# Patient Record
Sex: Female | Born: 1990 | Race: Black or African American | Hispanic: No | Marital: Married | Smoking: Current some day smoker
Health system: Southern US, Community
[De-identification: ages and names within clinical notes are randomized; demographics above are authoritative.]

## PROBLEM LIST (undated history)

## (undated) ENCOUNTER — Inpatient Hospital Stay (HOSPITAL_COMMUNITY): Payer: Self-pay

## (undated) DIAGNOSIS — E079 Disorder of thyroid, unspecified: Secondary | ICD-10-CM

## (undated) DIAGNOSIS — F32A Depression, unspecified: Secondary | ICD-10-CM

## (undated) DIAGNOSIS — F419 Anxiety disorder, unspecified: Secondary | ICD-10-CM

## (undated) DIAGNOSIS — F329 Major depressive disorder, single episode, unspecified: Secondary | ICD-10-CM

## (undated) HISTORY — PX: GANGLION CYST EXCISION: SHX1691

## (undated) HISTORY — PX: MOUTH SURGERY: SHX715

---

## 2015-06-20 ENCOUNTER — Emergency Department (HOSPITAL_BASED_OUTPATIENT_CLINIC_OR_DEPARTMENT_OTHER): Payer: Medicaid Other

## 2015-06-20 ENCOUNTER — Encounter (HOSPITAL_BASED_OUTPATIENT_CLINIC_OR_DEPARTMENT_OTHER): Payer: Self-pay | Admitting: Emergency Medicine

## 2015-06-20 ENCOUNTER — Emergency Department (HOSPITAL_BASED_OUTPATIENT_CLINIC_OR_DEPARTMENT_OTHER)
Admission: EM | Admit: 2015-06-20 | Discharge: 2015-06-21 | Disposition: A | Discharge status: Home / Self Care | Payer: Medicaid Other | Attending: Emergency Medicine | Admitting: Emergency Medicine

## 2015-06-20 DIAGNOSIS — Z3202 Encounter for pregnancy test, result negative: Secondary | ICD-10-CM | POA: Insufficient documentation

## 2015-06-20 DIAGNOSIS — S59911A Unspecified injury of right forearm, initial encounter: Secondary | ICD-10-CM | POA: Insufficient documentation

## 2015-06-20 DIAGNOSIS — S63501A Unspecified sprain of right wrist, initial encounter: Secondary | ICD-10-CM | POA: Diagnosis not present

## 2015-06-20 DIAGNOSIS — Y92410 Unspecified street and highway as the place of occurrence of the external cause: Secondary | ICD-10-CM | POA: Insufficient documentation

## 2015-06-20 DIAGNOSIS — S29012A Strain of muscle and tendon of back wall of thorax, initial encounter: Secondary | ICD-10-CM | POA: Diagnosis not present

## 2015-06-20 DIAGNOSIS — Y9389 Activity, other specified: Secondary | ICD-10-CM | POA: Insufficient documentation

## 2015-06-20 DIAGNOSIS — S29019A Strain of muscle and tendon of unspecified wall of thorax, initial encounter: Secondary | ICD-10-CM

## 2015-06-20 DIAGNOSIS — Y998 Other external cause status: Secondary | ICD-10-CM | POA: Insufficient documentation

## 2015-06-20 DIAGNOSIS — F17200 Nicotine dependence, unspecified, uncomplicated: Secondary | ICD-10-CM | POA: Diagnosis not present

## 2015-06-20 DIAGNOSIS — S6991XA Unspecified injury of right wrist, hand and finger(s), initial encounter: Secondary | ICD-10-CM | POA: Diagnosis present

## 2015-06-20 LAB — PREGNANCY, URINE: PREG TEST UR: NEGATIVE

## 2015-06-20 MED ORDER — HYDROCODONE-ACETAMINOPHEN 5-325 MG PO TABS
1.0000 | ORAL_TABLET | Freq: Once | ORAL | Status: AC
  Administered 2015-06-20: 1 via ORAL
  Filled 2015-06-20: qty 1

## 2015-06-20 NOTE — ED Notes (Signed)
MD at bedside. 
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
## 2015-06-21 MED ORDER — HYDROCODONE-ACETAMINOPHEN 5-325 MG PO TABS: 1.0000 | ORAL_TABLET | Freq: Four times a day (QID) | ORAL | Status: DC | PRN

## 2016-07-25 ENCOUNTER — Emergency Department (HOSPITAL_BASED_OUTPATIENT_CLINIC_OR_DEPARTMENT_OTHER)
Admission: EM | Admit: 2016-07-25 | Discharge: 2016-07-25 | Disposition: A | Discharge status: Home / Self Care | Payer: Medicaid Other | Attending: Emergency Medicine | Admitting: Emergency Medicine

## 2016-07-25 ENCOUNTER — Encounter (HOSPITAL_BASED_OUTPATIENT_CLINIC_OR_DEPARTMENT_OTHER): Payer: Self-pay | Admitting: Emergency Medicine

## 2016-07-25 DIAGNOSIS — F17200 Nicotine dependence, unspecified, uncomplicated: Secondary | ICD-10-CM | POA: Diagnosis not present

## 2016-07-25 DIAGNOSIS — J069 Acute upper respiratory infection, unspecified: Secondary | ICD-10-CM | POA: Diagnosis not present

## 2016-07-25 DIAGNOSIS — R51 Headache: Secondary | ICD-10-CM | POA: Diagnosis present

## 2016-07-25 MED ORDER — GUAIFENESIN ER 1200 MG PO TB12: 1.0000 | ORAL_TABLET | Freq: Two times a day (BID) | ORAL | 0 refills | Status: DC

## 2016-07-25 MED ORDER — PREDNISONE 50 MG PO TABS: 50.0000 mg | ORAL_TABLET | Freq: Every day | ORAL | 0 refills | Status: DC

## 2016-07-25 MED ORDER — BUDESONIDE 32 MCG/ACT NA SUSP: 2.0000 | Freq: Every day | NASAL | 0 refills | Status: DC

## 2016-07-25 MED ORDER — ACETAMINOPHEN-CODEINE 120-12 MG/5ML PO SOLN: 10.0000 mL | ORAL | 0 refills | Status: DC | PRN

## 2016-07-25 MED ORDER — HYDROCOD POLST-CPM POLST ER 10-8 MG/5ML PO SUER
5.0000 mL | Freq: Once | ORAL | Status: AC
  Administered 2016-07-25: 5 mL via ORAL
  Filled 2016-07-25: qty 5

## 2016-07-25 NOTE — ED Provider Notes (Signed)
[facility]   CSN: [id number] Arrival date & time: 07/25/16  2026 By signing my name below, I, Levon HedgerElizabeth Hall, attest that this documentation has been prepared under the direction and in the presence of non-physician practitioner, Ebbie Ridgehris Jennell Janosik, PA-C. Electronically Signed: Levon HedgerElizabeth Hall, Scribe. 07/25/2016. 10:48 PM.   History   Chief Complaint Chief Complaint  Patient presents with  . Facial Pain   HPI Katherine ConchStephanie Gregory is a 25 y.o. female who presents to the Emergency Department complaining of constant, moderate left sided sinus pain onset this morning. Pt states she has had URI symptoms for a few days prior to this; she notes associated photophobia, phonophobia, and left sided headache, sore throat, left sided dental pain, nasal drainage, nausea, and congestion. She has taken mucinex with no relief. No alleviating factors noted. Pt denies any vomiting or fever.   The history is provided by the patient. No language interpreter was used.   History reviewed. No pertinent past medical history.  There are no active problems to display for this patient.  History reviewed. No pertinent surgical history.  OB History    No data available      Home Medications    Prior to Admission medications   Medication Sig Start Date End Date Taking? Authorizing Provider  HYDROcodone-acetaminophen (NORCO) 5-325 MG tablet Take 1-2 tablets by mouth every 6 (six) hours as needed (for pain). 06/21/15   Paula LibraJohn Molpus, MD   Family History History reviewed. No pertinent family history.  Social History Social History  Substance Use Topics  . Smoking status: Current Every Day Smoker  . Smokeless tobacco: Never Used  . Alcohol use Yes    Allergies   Patient has no known allergies.  Review of Systems Review of Systems 10 systems reviewed and all are negative for acute change except as noted in the HPI.  Physical Exam Updated Vital Signs BP 102/62 (BP Location: Left  Arm)   Pulse 88   Temp 98 F (36.7 C) (Oral)   Resp 18   Ht 5\' 5"  (1.651 m)   Wt 255 lb (115.7 kg)   LMP 07/11/2016 (Approximate)   SpO2 100%   BMI 42.43 kg/m   Physical Exam  Constitutional: She is oriented to person, place, and time. She appears well-developed and well-nourished. No distress.  HENT:  Head: Normocephalic and atraumatic.  Mouth/Throat: Oropharynx is clear and moist. No oropharyngeal exudate.  Diffuse facial tenderness to palpation   Eyes: Pupils are equal, round, and reactive to light.  Neck: Normal range of motion. Neck supple.  Cardiovascular: Normal rate, regular rhythm and normal heart sounds.  Exam reveals no gallop and no friction rub.   No murmur heard. Pulmonary/Chest: Effort normal and breath sounds normal. No respiratory distress.  Neurological: She is alert and oriented to person, place, and time.  Skin: Skin is warm and dry. Capillary refill takes less than 2 seconds. No rash noted.  Psychiatric: She has a normal mood and affect.  Nursing note and vitals reviewed.  ED Treatments / Results  DIAGNOSTIC STUDIES:  Oxygen Saturation is 100% on RA, normal by my interpretation.    COORDINATION OF CARE:  10:45 PM Discussed treatment plan with pt at bedside and pt agreed to plan.   Labs (all labs ordered are listed, but only abnormal results are displayed) Labs Reviewed - No data to display  EKG  EKG Interpretation None      Radiology No results found.  Procedures Procedures (including critical care time)  Medications Ordered in ED Medications - No data to display   Initial Impression / Assessment and Plan / ED Course  I have reviewed the triage vital signs and the nursing notes.  Pertinent labs & imaging results that were available during my care of the patient were reviewed by me and considered in my medical decision making (see chart for details).  Clinical Course    The patient will be treated for viral URI, based on cough, nasal  congestion and sore throat.  Patient is advised of the plan and all questions were answered  Final Clinical Impressions(s) / ED Diagnoses   Final diagnoses:  None   New Prescriptions New Prescriptions   No medications on file  I personally performed the services described in this documentation, which was scribed in my presence. The recorded information has been reviewed and is accurate.   Charlestine NightChristopher Maxen Rowland, PA-C 07/25/16 2308    Loren Raceravid Yelverton, MD 07/26/16 [phone]

## 2016-07-25 NOTE — ED Triage Notes (Signed)
Patient reports left sided facial pain which began this morning.  Reports headache to left side of head and sinus pain.  Reports nasal drainage as well.

## 2016-07-25 NOTE — ED Notes (Signed)
Pt made aware to return if symptoms worsen or if any life threatening symptoms occur.   

## 2016-07-25 NOTE — Discharge Instructions (Addendum)
Return here as needed.  Increase your fluid intake, rest as much as possible °

## 2016-09-03 ENCOUNTER — Emergency Department (HOSPITAL_BASED_OUTPATIENT_CLINIC_OR_DEPARTMENT_OTHER)
Admission: EM | Admit: 2016-09-03 | Discharge: 2016-09-03 | Disposition: A | Discharge status: Home / Self Care | Payer: Medicaid Other | Attending: Emergency Medicine | Admitting: Emergency Medicine

## 2016-09-03 ENCOUNTER — Emergency Department (HOSPITAL_BASED_OUTPATIENT_CLINIC_OR_DEPARTMENT_OTHER): Payer: Medicaid Other

## 2016-09-03 ENCOUNTER — Encounter (HOSPITAL_BASED_OUTPATIENT_CLINIC_OR_DEPARTMENT_OTHER): Payer: Self-pay | Admitting: *Deleted

## 2016-09-03 DIAGNOSIS — R05 Cough: Secondary | ICD-10-CM | POA: Diagnosis not present

## 2016-09-03 DIAGNOSIS — F1290 Cannabis use, unspecified, uncomplicated: Secondary | ICD-10-CM | POA: Insufficient documentation

## 2016-09-03 DIAGNOSIS — R0982 Postnasal drip: Secondary | ICD-10-CM | POA: Diagnosis not present

## 2016-09-03 DIAGNOSIS — R000 Tachycardia, unspecified: Secondary | ICD-10-CM | POA: Diagnosis not present

## 2016-09-03 DIAGNOSIS — F17200 Nicotine dependence, unspecified, uncomplicated: Secondary | ICD-10-CM | POA: Insufficient documentation

## 2016-09-03 DIAGNOSIS — R69 Illness, unspecified: Secondary | ICD-10-CM

## 2016-09-03 DIAGNOSIS — R509 Fever, unspecified: Secondary | ICD-10-CM | POA: Insufficient documentation

## 2016-09-03 DIAGNOSIS — J111 Influenza due to unidentified influenza virus with other respiratory manifestations: Secondary | ICD-10-CM

## 2016-09-03 DIAGNOSIS — J3489 Other specified disorders of nose and nasal sinuses: Secondary | ICD-10-CM | POA: Insufficient documentation

## 2016-09-03 DIAGNOSIS — Z79899 Other long term (current) drug therapy: Secondary | ICD-10-CM | POA: Diagnosis not present

## 2016-09-03 HISTORY — DX: Disorder of thyroid, unspecified: E07.9

## 2016-09-03 MED ORDER — ACETAMINOPHEN 325 MG PO TABS
650.0000 mg | ORAL_TABLET | Freq: Once | ORAL | Status: AC
  Administered 2016-09-03: 650 mg via ORAL
  Filled 2016-09-03: qty 2

## 2016-09-03 MED ORDER — ONDANSETRON HCL 4 MG PO TABS: 4.0000 mg | ORAL_TABLET | Freq: Four times a day (QID) | ORAL | 0 refills | Status: DC

## 2016-09-03 MED ORDER — BENZONATATE 100 MG PO CAPS: 200.0000 mg | ORAL_CAPSULE | Freq: Two times a day (BID) | ORAL | 0 refills | Status: DC | PRN

## 2016-09-03 MED ORDER — OSELTAMIVIR PHOSPHATE 75 MG PO CAPS: 75.0000 mg | ORAL_CAPSULE | Freq: Two times a day (BID) | ORAL | 0 refills | Status: DC

## 2016-09-03 NOTE — ED Provider Notes (Signed)
[facility]   CSN: [id number] Arrival date & time: 09/03/16  [id number]     History   Chief Complaint Chief Complaint  Patient presents with  . Cough    HPI Katherine Shaw is a 26 y.o. female.  Patient w/PMH of thyroid disease, presents to the emergency department with flulike symptoms. She states that she has had a fever, chills, cough, sore throat, and body aches for the past 2 days. She has not tried taking anything for her symptoms. She denies getting a flu shot this year.  There are no aggravating or alleviating factors. There are no other associated symptoms. She denies any nausea, vomiting, or diarrhea.   The history is provided by the patient. No language interpreter was used.    Past Medical History:  Diagnosis Date  . Thyroid disease     There are no active problems to display for this patient.   History reviewed. No pertinent surgical history.  OB History    No data available       Home Medications    Prior to Admission medications   Medication Sig Start Date End Date Taking? Authorizing Provider  levothyroxine (SYNTHROID, LEVOTHROID) 25 MCG tablet Take 25 mcg by mouth daily before breakfast.   Yes Historical Provider, MD  sertraline (ZOLOFT) 25 MG tablet Take 25 mg by mouth daily.   Yes Historical Provider, MD  acetaminophen-codeine 120-12 MG/5ML solution Take 10 mLs by mouth every 4 (four) hours as needed for moderate pain. 07/25/16   Christopher Lawyer, PA-C  budesonide (RHINOCORT AQUA) 32 MCG/ACT nasal spray Place 2 sprays into both nostrils daily. 07/25/16   Christopher Lawyer, PA-C  Guaifenesin 1200 MG TB12 Take 1 tablet (1,200 mg total) by mouth 2 (two) times daily. 07/25/16   Charlestine Nighthristopher Lawyer, PA-C  HYDROcodone-acetaminophen (NORCO) 5-325 MG tablet Take 1-2 tablets by mouth every 6 (six) hours as needed (for pain). 06/21/15   John Molpus, MD  predniSONE (DELTASONE) 50 MG tablet Take 1 tablet (50 mg total) by mouth daily.  07/25/16   Charlestine Nighthristopher Lawyer, PA-C    Family History History reviewed. No pertinent family history.  Social History Social History  Substance Use Topics  . Smoking status: Current Every Day Smoker  . Smokeless tobacco: Never Used  . Alcohol use Yes     Allergies   Patient has no known allergies.   Review of Systems Review of Systems  Constitutional: Positive for chills and fever.  HENT: Positive for postnasal drip and rhinorrhea. Negative for sinus pressure, sneezing and sore throat.   Respiratory: Positive for cough. Negative for shortness of breath.   Cardiovascular: Negative for chest pain.  Gastrointestinal: Negative for abdominal pain, constipation, diarrhea, nausea and vomiting.  Genitourinary: Negative for dysuria.     Physical Exam Updated Vital Signs BP 100/83 (BP Location: Left Arm)   Pulse 111   Temp 100.4 F (38 C)   Resp 18   Ht 5\' 6"  (1.676 m)   Wt 118.4 kg   LMP 08/09/2016   SpO2 100%   BMI 42.13 kg/m   Physical Exam Nursing note and vitals reviewed.  Constitutional: Appears well-developed and well-nourished. No distress.  HENT: Oropharynx is mildly erythematous, no tonsillar exudates or abscess, airway intact. Eyes: Conjunctivae are normal. Pupils are equal, round, and reactive to light.  Neck: Normal range of motion and full passive range of motion without pain.  Cardiovascular: Tachycardic (111), regular rhythm   Pulmonary/Chest: Effort normal and breath sounds normal. No stridor.  No wheezes, rhonchi, or rales. Abdominal: Soft. There is no focal abdominal tenderness.  Musculoskeletal: Normal range of motion. Moves all extremities. Neurological: Pt is alert and oriented to person, place, and time. Sensation and strength grossly intact throughout. Skin: Skin is warm and dry. No rash noted. Pt is not diaphoretic.  Psychiatric: Normal mood and affect.    ED Treatments / Results  Labs (all labs ordered are listed, but only abnormal results  are displayed) Labs Reviewed - No data to display  EKG  EKG Interpretation None       Radiology Dg Chest 2 View  Result Date: 09/03/2016 CLINICAL DATA:  Cough and fever for 2 days EXAM: CHEST  2 VIEW COMPARISON:  None. FINDINGS: Lungs are clear. Heart size and pulmonary vascularity are normal. No adenopathy. No bone lesions. IMPRESSION: No edema or consolidation. Electronically Signed   By: Bretta Bang III M.D.   On: 09/03/2016 09:28    Procedures Procedures (including critical care time)  Medications Ordered in ED Medications  acetaminophen (TYLENOL) tablet 650 mg (650 mg Oral Given 09/03/16 0916)     Initial Impression / Assessment and Plan / ED Course  I have reviewed the triage vital signs and the nursing notes.  Pertinent labs & imaging results that were available during my care of the patient were reviewed by me and considered in my medical decision making (see chart for details).     Patient with symptoms consistent with influenza.  Onset less than two days ago. Patient is non-toxic appearing.  CXR is negative. Discussed Tamiflu with the patient. Patient is not in acute distress.  I believe patient to be stable for outpatient treatment with OTC cough and cold medicine, tamiflu, and zofran.     Final Clinical Impressions(s) / ED Diagnoses   Final diagnoses:  Influenza-like illness    New Prescriptions New Prescriptions   No medications on file     Roxy Horseman, PA-C 09/03/16 1610    Vanetta Mulders, MD 09/04/16 [phone]

## 2016-09-03 NOTE — ED Triage Notes (Signed)
Pt reports cough x 2 days with low grade temps, chills and body aches.

## 2017-06-26 ENCOUNTER — Emergency Department (HOSPITAL_BASED_OUTPATIENT_CLINIC_OR_DEPARTMENT_OTHER)
Admission: EM | Admit: 2017-06-26 | Discharge: 2017-06-26 | Disposition: A | Discharge status: Home / Self Care | Payer: Medicaid Other | Attending: Emergency Medicine | Admitting: Emergency Medicine

## 2017-06-26 ENCOUNTER — Other Ambulatory Visit: Payer: Self-pay

## 2017-06-26 ENCOUNTER — Encounter (HOSPITAL_BASED_OUTPATIENT_CLINIC_OR_DEPARTMENT_OTHER): Payer: Self-pay | Admitting: Emergency Medicine

## 2017-06-26 DIAGNOSIS — M67432 Ganglion, left wrist: Secondary | ICD-10-CM

## 2017-06-26 DIAGNOSIS — Z79899 Other long term (current) drug therapy: Secondary | ICD-10-CM | POA: Insufficient documentation

## 2017-06-26 DIAGNOSIS — F17210 Nicotine dependence, cigarettes, uncomplicated: Secondary | ICD-10-CM | POA: Diagnosis not present

## 2017-06-26 DIAGNOSIS — M25532 Pain in left wrist: Secondary | ICD-10-CM | POA: Diagnosis present

## 2017-06-26 MED ORDER — LIDOCAINE HCL (PF) 1 % IJ SOLN
5.0000 mL | Freq: Once | INTRAMUSCULAR | Status: AC
  Administered 2017-06-26: 5 mL
  Filled 2017-06-26: qty 5

## 2017-06-26 NOTE — ED Notes (Signed)
Pt given d/c instructions as per chart. Verbalizes understanding. No questions. Reemphasizes splint care and checked fitting.

## 2017-06-26 NOTE — ED Triage Notes (Signed)
Reports cyst to left wrist that she is to have surgery on but states she has not been able to have a consultation yet.  Reports pain is increasing and causing her to be unable to do day to day activities such as drive.

## 2017-06-26 NOTE — ED Provider Notes (Signed)
[facility] EMERGENCY DEPARTMENT Provider Note   CSN: [id number] Arrival date & time: 06/26/17  1355     History   Chief Complaint Chief Complaint  Patient presents with  . Cyst    HPI Katherine Shaw is a 26 y.o. female who presents with a 4-22-month history of ganglion cyst of the left wrist.  She has been evaluated by her PCP and was told she may need surgery, however has not had any further management.  Patient reports her pain has been getting worse and has been hurting her during daily activities such as driving.  Her pain is radiating up to her elbow.  She denies any new injuries.  She intermittently has paresthesias and numbness.  She denies any other symptoms.  She has not taken any medications at home for symptoms.  HPI  Past Medical History:  Diagnosis Date  . Thyroid disease     There are no active problems to display for this patient.   History reviewed. No pertinent surgical history.  OB History    No data available       Home Medications    Prior to Admission medications   Medication Sig Start Date End Date Taking? Authorizing Provider  acetaminophen-codeine 120-12 MG/5ML solution Take 10 mLs by mouth every 4 (four) hours as needed for moderate pain. 07/25/16   Lawyer, Cristal Deer, PA-C  benzonatate (TESSALON) 100 MG capsule Take 2 capsules (200 mg total) by mouth 2 (two) times daily as needed for cough. 09/03/16   Roxy Horseman, PA-C  budesonide (RHINOCORT AQUA) 32 MCG/ACT nasal spray Place 2 sprays into both nostrils daily. 07/25/16   Lawyer, Cristal Deer, PA-C  Guaifenesin 1200 MG TB12 Take 1 tablet (1,200 mg total) by mouth 2 (two) times daily. 07/25/16   Lawyer, Cristal Deer, PA-C  HYDROcodone-acetaminophen (NORCO) 5-325 MG tablet Take 1-2 tablets by mouth every 6 (six) hours as needed (for pain). 06/21/15   Molpus, John, MD  levothyroxine (SYNTHROID, LEVOTHROID) 25 MCG tablet Take 25 mcg by mouth daily before breakfast.    [provider]  ondansetron (ZOFRAN) 4 MG tablet Take 1 tablet (4 mg total) by mouth every 6 (six) hours. 09/03/16   Roxy Horseman, PA-C  oseltamivir (TAMIFLU) 75 MG capsule Take 1 capsule (75 mg total) by mouth every 12 (twelve) hours. 09/03/16   Roxy Horseman, PA-C  predniSONE (DELTASONE) 50 MG tablet Take 1 tablet (50 mg total) by mouth daily. 07/25/16   Lawyer, Cristal Deer, PA-C  sertraline (ZOLOFT) 25 MG tablet Take 25 mg by mouth daily.    [provider]    Family History History reviewed. No pertinent family history.  Social History Social History   Tobacco Use  . Smoking status: Current Every Day Smoker    Packs/day: 0.50    Types: Cigarettes  . Smokeless tobacco: Never Used  Substance Use Topics  . Alcohol use: Yes  . Drug use: No     Allergies   Patient has no known allergies.   Review of Systems Review of Systems  Constitutional: Negative for fever.  Musculoskeletal: Positive for arthralgias (L wrist).  Neurological: Positive for numbness.     Physical Exam Updated Vital Signs BP 111/77 (BP Location: Right Arm)   Pulse 74   Temp 98.2 F (36.8 C) (Oral)   Resp 18   Ht 5\' 7"  (1.702 m)   Wt 107.1 kg (236 lb 1.8 oz)   LMP 06/02/2017   SpO2 100%   BMI 36.98 kg/m   Physical  Exam  Constitutional: She appears well-developed and well-nourished. No distress.  HENT:  Head: Normocephalic and atraumatic.  Mouth/Throat: Oropharynx is clear and moist. No oropharyngeal exudate.  Eyes: Conjunctivae are normal. Pupils are equal, round, and reactive to light. Right eye exhibits no discharge. Left eye exhibits no discharge. No scleral icterus.  Neck: Normal range of motion. Neck supple. No thyromegaly present.  Cardiovascular: Normal rate, regular rhythm, normal heart sounds and intact distal pulses. Exam reveals no gallop and no friction rub.  No murmur heard. Pulmonary/Chest: Effort normal and breath sounds normal. No stridor. No respiratory distress.  She has no wheezes. She has no rales.  Abdominal: Soft. Bowel sounds are normal. She exhibits no distension. There is no tenderness. There is no rebound and no guarding.  Musculoskeletal: She exhibits no edema.  Large cyst over the dorsal left wrist -see photo Range of motion of wrist intact, however limited due to pain; normal sensation intact; radial pulse intact; full range of motion of all digits  Lymphadenopathy:    She has no cervical adenopathy.  Neurological: She is alert. Coordination normal.  Skin: Skin is warm and dry. No rash noted. She is not diaphoretic. No pallor.  Psychiatric: She has a normal mood and affect.  Nursing note and vitals reviewed.      ED Treatments / Results  Labs (all labs ordered are listed, but only abnormal results are displayed) Labs Reviewed - No data to display  EKG  EKG Interpretation None       Radiology No results found.  Procedures Aspiration of blood/fluid Date/Time: 06/26/2017 5:40 PM Performed by: Emi HolesLaw, Lenor Provencher M, PA-C Authorized by: Emi HolesLaw, Frederick Klinger M, PA-C  Consent: Verbal consent obtained. Consent given by: patient Patient identity confirmed: verbally with patient Local anesthesia used: yes Anesthesia: local infiltration  Anesthesia: Local anesthesia used: yes Local Anesthetic: lidocaine 1% without epinephrine Anesthetic total: 1 mL  Sedation: Patient sedated: no  Patient tolerance: Patient tolerated the procedure well with no immediate complications Comments: Aspiration of left wrist ganglion cyst conducted by myself.  I prepped and cleaned the area with ChloraPrep.  I first injected 1 mL of lidocaine 1% over the area of intended respiration.  I then introduced a 18-gauge needle and pulled back obtaining about 1 mL of yellow, clear gelatinous fluid.  Suction limited further fluid aspiration, so it was removed.  I cleaned the area again with ChloraPrep and injected another 18-gauge needle with a 10 cc syringe and  obtained another 2 mm of bloody gelatinous fluid.  Patient did have good improvement of symptoms in range of motion following procedure.  Small amount of bacitracin applied to injection area after bleeding stopped with pressure.  Dressed with 4 x 4 of gauze.    (including critical care time)  Medications Ordered in ED Medications  lidocaine (PF) (XYLOCAINE) 1 % injection 5 mL (5 mLs Infiltration Given 06/26/17 1454)     Initial Impression / Assessment and Plan / ED Course  I have reviewed the triage vital signs and the nursing notes.  Pertinent labs & imaging results that were available during my care of the patient were reviewed by me and considered in my medical decision making (see chart for details).     Patient with left ganglion cyst, confirmed by previous ultrasound.  Aspiration performed by myself today for symptomatic treatment.  Patient tolerated procedure well and did have improvement of symptoms.  Will discharge home with follow-up to hand surgery.  We will also discharge home with  brace.  Patient encouraged to take ibuprofen as needed, as prescribed over-the-counter for pain.  Patient given strict return precautions.  She understands and agrees with plan.  Patient vitals stable throughout ED course and discharged in satisfactory condition. I discussed patient case with Dr. Denton LankSteinl who guided the patient's management and agrees with plan.   Final Clinical Impressions(s) / ED Diagnoses   Final diagnoses:  Ganglion cyst of wrist, left    ED Discharge Orders    None       Emi HolesLaw, Shine Mikes M, PA-C 06/26/17 1744    Cathren LaineSteinl, Kevin, MD 06/27/17 0730

## 2017-06-26 NOTE — Discharge Instructions (Signed)
Please follow-up with the hand doctor for further evaluation and treatment of your ganglion cyst.  You can wear the brace for comfort whenever needed.  You can take ibuprofen as prescribed, as needed for your pain.  Please return to the emergency department if you develop any new or worsening symptoms including increasing pain, redness, swelling, red streaking from the area, significant swelling of the joint, or fevers.

## 2017-07-08 ENCOUNTER — Inpatient Hospital Stay (HOSPITAL_COMMUNITY)
Admission: AD | Admit: 2017-07-08 | Discharge: 2017-07-09 | Disposition: A | Discharge status: Home / Self Care | Payer: Medicaid Other | Source: Ambulatory Visit | Attending: Obstetrics and Gynecology | Admitting: Obstetrics and Gynecology

## 2017-07-08 ENCOUNTER — Encounter (HOSPITAL_COMMUNITY): Payer: Self-pay

## 2017-07-08 DIAGNOSIS — Z3A01 Less than 8 weeks gestation of pregnancy: Secondary | ICD-10-CM | POA: Diagnosis not present

## 2017-07-08 DIAGNOSIS — F329 Major depressive disorder, single episode, unspecified: Secondary | ICD-10-CM | POA: Diagnosis not present

## 2017-07-08 DIAGNOSIS — F419 Anxiety disorder, unspecified: Secondary | ICD-10-CM | POA: Diagnosis not present

## 2017-07-08 DIAGNOSIS — O99341 Other mental disorders complicating pregnancy, first trimester: Secondary | ICD-10-CM | POA: Insufficient documentation

## 2017-07-08 DIAGNOSIS — Z79899 Other long term (current) drug therapy: Secondary | ICD-10-CM | POA: Diagnosis not present

## 2017-07-08 DIAGNOSIS — O26891 Other specified pregnancy related conditions, first trimester: Secondary | ICD-10-CM | POA: Insufficient documentation

## 2017-07-08 DIAGNOSIS — Z87891 Personal history of nicotine dependence: Secondary | ICD-10-CM | POA: Insufficient documentation

## 2017-07-08 DIAGNOSIS — Z3490 Encounter for supervision of normal pregnancy, unspecified, unspecified trimester: Secondary | ICD-10-CM

## 2017-07-08 DIAGNOSIS — R1030 Lower abdominal pain, unspecified: Secondary | ICD-10-CM | POA: Diagnosis not present

## 2017-07-08 DIAGNOSIS — O209 Hemorrhage in early pregnancy, unspecified: Secondary | ICD-10-CM | POA: Insufficient documentation

## 2017-07-08 DIAGNOSIS — W19XXXA Unspecified fall, initial encounter: Secondary | ICD-10-CM

## 2017-07-08 DIAGNOSIS — R109 Unspecified abdominal pain: Secondary | ICD-10-CM | POA: Diagnosis not present

## 2017-07-08 HISTORY — DX: Major depressive disorder, single episode, unspecified: F32.9

## 2017-07-08 HISTORY — DX: Anxiety disorder, unspecified: F41.9

## 2017-07-08 HISTORY — DX: Depression, unspecified: F32.A

## 2017-07-08 LAB — POCT PREGNANCY, URINE: PREG TEST UR: POSITIVE — AB
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
## 2017-07-09 ENCOUNTER — Encounter (HOSPITAL_COMMUNITY): Payer: Self-pay

## 2017-07-09 ENCOUNTER — Inpatient Hospital Stay (HOSPITAL_COMMUNITY): Payer: Medicaid Other

## 2017-07-09 DIAGNOSIS — R109 Unspecified abdominal pain: Secondary | ICD-10-CM

## 2017-07-09 DIAGNOSIS — O26891 Other specified pregnancy related conditions, first trimester: Secondary | ICD-10-CM

## 2017-07-09 LAB — URINALYSIS, ROUTINE W REFLEX MICROSCOPIC
Bacteria, UA: NONE SEEN
Bilirubin Urine: NEGATIVE
GLUCOSE, UA: NEGATIVE mg/dL
KETONES UR: NEGATIVE mg/dL
LEUKOCYTES UA: NEGATIVE
Nitrite: NEGATIVE
PH: 5 (ref 5.0–8.0)
Protein, ur: NEGATIVE mg/dL
Specific Gravity, Urine: 1.021 (ref 1.005–1.030)

## 2017-07-09 LAB — ABO/RH: ABO/RH(D): O POS

## 2017-07-09 LAB — HCG, QUANTITATIVE, PREGNANCY: HCG, BETA CHAIN, QUANT, S: 7437 m[IU]/mL — AB (ref <5)

## 2017-07-09 MED ORDER — CYCLOBENZAPRINE HCL 5 MG PO TABS: 5.0000 mg | ORAL_TABLET | Freq: Three times a day (TID) | ORAL | 0 refills | Status: DC | PRN

## 2017-07-09 NOTE — Discharge Instructions (Signed)
First Trimester of Pregnancy The first trimester of pregnancy is from week 1 until the end of week 13 (months 1 through 3). During this time, your baby will begin to develop inside you. At 6-8 weeks, the eyes and face are formed, and the heartbeat can be seen on ultrasound. At the end of 12 weeks, all the baby's organs are formed. Prenatal care is all the medical care you receive before the birth of your baby. Make sure you get good prenatal care and follow all of your doctor's instructions. Follow these instructions at home: Medicines  Take over-the-counter and prescription medicines only as told by your doctor. Some medicines are safe and some medicines are not safe during pregnancy.  Take a prenatal vitamin that contains at least 600 micrograms (mcg) of folic acid.  If you have trouble pooping (constipation), take medicine that will make your stool soft (stool softener) if your doctor approves. Eating and drinking  Eat regular, healthy meals.  Your doctor will tell you the amount of weight gain that is right for you.  Avoid raw meat and uncooked cheese.  If you feel sick to your stomach (nauseous) or throw up (vomit): ? Eat 4 or 5 small meals a day instead of 3 large meals. ? Try eating a few soda crackers. ? Drink liquids between meals instead of during meals.  To prevent constipation: ? Eat foods that are high in fiber, like fresh fruits and vegetables, whole grains, and beans. ? Drink enough fluids to keep your pee (urine) clear or pale yellow. Activity  Exercise only as told by your doctor. Stop exercising if you have cramps or pain in your lower belly (abdomen) or low back.  Do not exercise if it is too hot, too humid, or if you are in a place of great height (high altitude).  Try to avoid standing for long periods of time. Move your legs often if you must stand in one place for a long time.  Avoid heavy lifting.  Wear low-heeled shoes. Sit and stand up straight.  You  can have sex unless your doctor tells you not to. Relieving pain and discomfort  Wear a good support bra if your breasts are sore.  Take warm water baths (sitz baths) to soothe pain or discomfort caused by hemorrhoids. Use hemorrhoid cream if your doctor says it is okay.  Rest with your legs raised if you have leg cramps or low back pain.  If you have puffy, bulging veins (varicose veins) in your legs: ? Wear support hose or compression stockings as told by your doctor. ? Raise (elevate) your feet for 15 minutes, 3-4 times a day. ? Limit salt in your food. Prenatal care  Schedule your prenatal visits by the twelfth week of pregnancy.  Write down your questions. Take them to your prenatal visits.  Keep all your prenatal visits as told by your doctor. This is important. Safety  Wear your seat belt at all times when driving.  Make a list of emergency phone numbers. The list should include numbers for family, friends, the hospital, and police and fire departments. General instructions  Ask your doctor for a referral to a local prenatal class. Begin classes no later than at the start of month 6 of your pregnancy.  Ask for help if you need counseling or if you need help with nutrition. Your doctor can give you advice or tell you where to go for help.  Do not use hot tubs, steam rooms, or   saunas.  Do not douche or use tampons or scented sanitary pads.  Do not cross your legs for long periods of time.  Avoid all herbs and alcohol. Avoid drugs that are not approved by your doctor.  Do not use any tobacco products, including cigarettes, chewing tobacco, and electronic cigarettes. If you need help quitting, ask your doctor. You may get counseling or other support to help you quit.  Avoid cat litter boxes and soil used by cats. These carry germs that can cause birth defects in the baby and can cause a loss of your baby (miscarriage) or stillbirth.  Visit your dentist. At home, brush  your teeth with a soft toothbrush. Be gentle when you floss. Contact a doctor if:  You are dizzy.  You have mild cramps or pressure in your lower belly.  You have a nagging pain in your belly area.  You continue to feel sick to your stomach, you throw up, or you have watery poop (diarrhea).  You have a bad smelling fluid coming from your vagina.  You have pain when you pee (urinate).  You have increased puffiness (swelling) in your face, hands, legs, or ankles. Get help right away if:  You have a fever.  You are leaking fluid from your vagina.  You have spotting or bleeding from your vagina.  You have very bad belly cramping or pain.  You gain or lose weight rapidly.  You throw up blood. It may look like coffee grounds.  You are around people who have German measles, fifth disease, or chickenpox.  You have a very bad headache.  You have shortness of breath.  You have any kind of trauma, such as from a fall or a car accident. Summary  The first trimester of pregnancy is from week 1 until the end of week 13 (months 1 through 3).  To take care of yourself and your unborn baby, you will need to eat healthy meals, take medicines only if your doctor tells you to do so, and do activities that are safe for you and your baby.  Keep all follow-up visits as told by your doctor. This is important as your doctor will have to ensure that your baby is healthy and growing well. This information is not intended to replace advice given to you by your health care provider. Make sure you discuss any questions you have with your health care provider. Document Released: 12/30/2007 Document Revised: 07/21/2016 Document Reviewed: 07/21/2016 Elsevier Interactive Patient Education  2017 Elsevier Inc[employer]  

## 2017-07-09 NOTE — MAU Provider Note (Signed)
Chief Complaint: Fall; Possible Pregnancy; Abdominal Pain; Back Pain; and Morning Sickness   SUBJECTIVE HPI: Katherine Shaw is a 26 y.o. [id number] at Unknown who presents to MAU after fall. Patient states she had a positive home pregnancy test several weeks ago. Has not had any official US or dating. She fell in the snow several days ago but states her abdominal and back pain from fall are persisting. She tried Tylenol without relief. Pain is minimal. Pain is described as sharp in her abdomen. Back pain is midline without radiation. She denies vaginal bleeding.  Patient's last menstrual period was 06/02/2017. EDD based off this is 03/09/18 with GA of [redacted]w[redacted]d.    Past Medical History:  Diagnosis Date  . Anxiety   . Depression   . Thyroid disease    OB History  Gravida Para Term Preterm AB Living  3 2 2  0 1 2  SAB TAB Ectopic Multiple Live Births  1 0 0 1 2    # Outcome Date GA Lbr Len/2nd Weight Sex Delivery Anes PTL Lv  3 Term 07/24/11     Vag-Spont   LIV  2 Term 07/24/11     Vag-Spont   LIV  1 SAB              History reviewed. No pertinent surgical history. Social History   Socioeconomic History  . Marital status: Married    Spouse name: Not on file  . Number of children: Not on file  . Years of education: Not on file  . Highest education level: Not on file  Social Needs  . Financial resource strain: Not on file  . Food insecurity - worry: Not on file  . Food insecurity - inability: Not on file  . Transportation needs - medical: Not on file  . Transportation needs - non-medical: Not on file  Occupational History  . Not on file  Tobacco Use  . Smoking status: Former Smoker    Packs/day: 0.50    Types: Cigarettes  . Smokeless tobacco: Never Used  Substance and Sexual Activity  . Alcohol use: Yes  . Drug use: No  . Sexual activity: Yes  Other Topics Concern  . Not on file  Social History Narrative  . Not on file   No current facility-administered medications on  file prior to encounter.    Current Outpatient Medications on File Prior to Encounter  Medication Sig Dispense Refill  . Prenatal Vit-Fe Fumarate-FA (PRENATAL MULTIVITAMIN) TABS tablet Take 1 tablet by mouth daily at 12 noon.    Marland Kitchen acetaminophen-codeine 120-12 MG/5ML solution Take 10 mLs by mouth every 4 (four) hours as needed for moderate pain. 120 mL 0  . benzonatate (TESSALON) 100 MG capsule Take 2 capsules (200 mg total) by mouth 2 (two) times daily as needed for cough. 20 capsule 0  . budesonide (RHINOCORT AQUA) 32 MCG/ACT nasal spray Place 2 sprays into both nostrils daily. 1 Bottle 0  . Guaifenesin 1200 MG TB12 Take 1 tablet (1,200 mg total) by mouth 2 (two) times daily. 20 each 0  . HYDROcodone-acetaminophen (NORCO) 5-325 MG tablet Take 1-2 tablets by mouth every 6 (six) hours as needed (for pain). 10 tablet 0  . levothyroxine (SYNTHROID, LEVOTHROID) 25 MCG tablet Take 25 mcg by mouth daily before breakfast.    . ondansetron (ZOFRAN) 4 MG tablet Take 1 tablet (4 mg total) by mouth every 6 (six) hours. 12 tablet 0  . oseltamivir (TAMIFLU) 75 MG capsule Take 1 capsule (75 mg  total) by mouth every 12 (twelve) hours. 10 capsule 0  . predniSONE (DELTASONE) 50 MG tablet Take 1 tablet (50 mg total) by mouth daily. 5 tablet 0  . sertraline (ZOLOFT) 25 MG tablet Take 25 mg by mouth daily.     No Known Allergies  I have reviewed the past Medical Hx, Surgical Hx, Social Hx, Allergies and Medications.   REVIEW OF SYSTEMS All systems reviewed and are negative for acute change except as noted in the HPI.   OBJECTIVE BP 117/65 (BP Location: Right Arm)   Pulse 81   Temp 98.4 F (36.9 C) (Oral)   Resp 18   Ht 5\' 5"  (1.651 m)   Wt 106.1 kg (234 lb)   LMP 06/02/2017   SpO2 100%   BMI 38.94 kg/m    PHYSICAL EXAM Constitutional: Well-developed, well-nourished female in no acute distress.  Cardiovascular: normal rate and rhythm, pulses intact Respiratory: normal rate and effort.  GI: Abd  soft, non-tender, non-distended. Pos BS x 4 MS: Extremities nontender, no edema, normal ROM. Back with paraspinal muscle spasm to lower back.  Neurologic: Alert and oriented x 4. No focal deficits GU: Neg CVAT. SPECULUM EXAM: NEFG, physiologic discharge, no blood noted, cervix clean BIMANUAL: uterus normal size, no adnexal tenderness or masses. No CMT. Psych: normal mood and affect  LAB RESULTS Results for orders placed or performed during the hospital encounter of 07/08/17 (from the past 24 hour(s))  Urinalysis, Routine w reflex microscopic     Status: Abnormal   Collection Time: 07/08/17 11:54 PM  Result Value Ref Range   Color, Urine YELLOW YELLOW   APPearance HAZY (A) CLEAR   Specific Gravity, Urine 1.021 1.005 - 1.030   pH 5.0 5.0 - 8.0   Glucose, UA NEGATIVE NEGATIVE mg/dL   Hgb urine dipstick MODERATE (A) NEGATIVE   Bilirubin Urine NEGATIVE NEGATIVE   Ketones, ur NEGATIVE NEGATIVE mg/dL   Protein, ur NEGATIVE NEGATIVE mg/dL   Nitrite NEGATIVE NEGATIVE   Leukocytes, UA NEGATIVE NEGATIVE   RBC / HPF 0-5 0 - 5 RBC/hpf   WBC, UA 0-5 0 - 5 WBC/hpf   Bacteria, UA NONE SEEN NONE SEEN   Squamous Epithelial / LPF 6-30 (A) NONE SEEN   Mucus PRESENT   Pregnancy, urine POC     Status: Abnormal   Collection Time: 07/09/17 12:02 AM  Result Value Ref Range   Preg Test, Ur POSITIVE (A) NEGATIVE    IMAGING [place] Ob Comp Less 14 Wks  Result Date: 07/09/2017 CLINICAL DATA:  Acute onset of lower abdominal pain. EXAM: OBSTETRIC <14 WK US AND TRANSVAGINAL OB US TECHNIQUE: Both transabdominal and transvaginal ultrasound examinations were performed for complete evaluation of the gestation as well as the maternal uterus, adnexal regions, and pelvic cul-de-sac. Transvaginal technique was performed to assess early pregnancy. COMPARISON:  None. FINDINGS: Intrauterine gestational sac: Single; visualized and normal in shape. Yolk sac:  Yes Embryo:  No MSD: 7.5  mm   5 w   3  d Subchorionic hemorrhage:   None visualized. Maternal uterus/adnexae: The uterus is unremarkable in appearance. The ovaries are within normal limits. The right ovary measures 4.1 x 2.3 x 2.4 cm, while the left ovary measures 3.5 x 1.9 x 1.9 cm. No suspicious adnexal masses are seen; there is no evidence for ovarian torsion. Trace free fluid is seen within the pelvic cul-de-sac. IMPRESSION: Single intrauterine gestational sac noted, with a mean sac diameter of 8 mm, corresponding to a gestational age of 579  weeks 3 days. This matches the gestational age of [redacted] weeks 2 days by LMP, reflecting an estimated date of delivery of March 09, 2018. A yolk sac is seen. No embryo is yet visualized, within normal limits. Electronically Signed   By: Roanna RaiderJeffery  Chang M.D.   On: 07/09/2017 03:12   [place] Ob Transvaginal  Result Date: 07/09/2017 CLINICAL DATA:  Acute onset of lower abdominal pain. EXAM: OBSTETRIC <14 WK US AND TRANSVAGINAL OB US TECHNIQUE: Both transabdominal and transvaginal ultrasound examinations were performed for complete evaluation of the gestation as well as the maternal uterus, adnexal regions, and pelvic cul-de-sac. Transvaginal technique was performed to assess early pregnancy. COMPARISON:  None. FINDINGS: Intrauterine gestational sac: Single; visualized and normal in shape. Yolk sac:  Yes Embryo:  No MSD: 7.5  mm   5 w   3  d Subchorionic hemorrhage:  None visualized. Maternal uterus/adnexae: The uterus is unremarkable in appearance. The ovaries are within normal limits. The right ovary measures 4.1 x 2.3 x 2.4 cm, while the left ovary measures 3.5 x 1.9 x 1.9 cm. No suspicious adnexal masses are seen; there is no evidence for ovarian torsion. Trace free fluid is seen within the pelvic cul-de-sac. IMPRESSION: Single intrauterine gestational sac noted, with a mean sac diameter of 8 mm, corresponding to a gestational age of [redacted] weeks 3 days. This matches the gestational age of [redacted] weeks 2 days by LMP, reflecting an estimated date of delivery  of March 09, 2018. A yolk sac is seen. No embryo is yet visualized, within normal limits. Electronically Signed   By: Roanna RaiderJeffery  Chang M.D.   On: 07/09/2017 03:12    MAU COURSE Vitals and nursing notes reviewed I have ordered labs/imaging and reviewed them Pos preg test US showing SIUP with GA consistent with LMP Treatments given in MAU:  MDM Plan of care reviewed with patient, including labs and tests ordered and medical treatment.   ASSESSMENT 1. Early stage of pregnancy   2. Vaginal bleeding in pregnancy, first trimester   3. Abdominal pain during pregnancy in first trimester   4. Lower abdominal pain   5. Fall, initial encounter     PLAN Discharge home in stable condition. RX for flexeril for back pain NOB appointment scheduled Counseled on return precautions Handout given   Caryl AdaJazma Phelps, DO OB Fellow Faculty Practice, [facility] 07/09/2017, 1:06 AM

## 2017-07-15 DIAGNOSIS — M67432 Ganglion, left wrist: Secondary | ICD-10-CM | POA: Insufficient documentation

## 2017-07-27 NOTE — L&D Delivery Note (Signed)
Delivery Note Pt became complete at 1112 and pushed well. At 11:30 AM a viable female was delivered via Vaginal, Spontaneous (Presentation: ROP).  APGAR: 8, 9; weight: pending. Infant dried and placed on pt's abd; cord clamped and cut by FOB after 1 min; hospital blood sample collected.   Placenta status: spont , intact .  Cord: 3 vessel  Anesthesia:  Epidural Episiotomy: None Lacerations: None Est. Blood Loss (mL): 50  Mom to OR after brief recovery for elective ppBTL.  Baby to Couplet care / Skin to Skin.  Cam HaiSHAW, KIMBERLY CNM 03/11/2018, 12:01 PM  Please schedule this patient for Postpartum visit in: 4 weeks with the following provider: Any provider For C/S patients schedule nurse incision check in weeks 2 weeks: no Low risk pregnancy complicated by: hypothyroid Delivery mode:  SVD Anticipated Birth Control:  BTL done PP PP Procedures needed: TSH check  Schedule Integrated BH visit: no

## 2017-08-11 ENCOUNTER — Ambulatory Visit (INDEPENDENT_AMBULATORY_CARE_PROVIDER_SITE_OTHER): Payer: Medicaid Other | Admitting: Obstetrics & Gynecology

## 2017-08-11 ENCOUNTER — Encounter: Payer: Self-pay | Admitting: Obstetrics & Gynecology

## 2017-08-11 ENCOUNTER — Other Ambulatory Visit (HOSPITAL_COMMUNITY)
Admission: RE | Admit: 2017-08-11 | Discharge: 2017-08-11 | Disposition: A | Discharge status: Home / Self Care | Payer: Medicaid Other | Source: Ambulatory Visit | Attending: Obstetrics & Gynecology | Admitting: Obstetrics & Gynecology

## 2017-08-11 VITALS — BP 109/58 | HR 82 | Wt 241.0 lb

## 2017-08-11 DIAGNOSIS — Z3490 Encounter for supervision of normal pregnancy, unspecified, unspecified trimester: Secondary | ICD-10-CM | POA: Diagnosis present

## 2017-08-11 DIAGNOSIS — O99331 Smoking (tobacco) complicating pregnancy, first trimester: Secondary | ICD-10-CM

## 2017-08-11 DIAGNOSIS — Z3480 Encounter for supervision of other normal pregnancy, unspecified trimester: Secondary | ICD-10-CM

## 2017-08-11 DIAGNOSIS — E039 Hypothyroidism, unspecified: Secondary | ICD-10-CM | POA: Insufficient documentation

## 2017-08-11 DIAGNOSIS — O99330 Smoking (tobacco) complicating pregnancy, unspecified trimester: Secondary | ICD-10-CM

## 2017-08-11 DIAGNOSIS — Z3687 Encounter for antenatal screening for uncertain dates: Secondary | ICD-10-CM | POA: Diagnosis not present

## 2017-08-11 DIAGNOSIS — E079 Disorder of thyroid, unspecified: Secondary | ICD-10-CM

## 2017-08-11 DIAGNOSIS — O99280 Endocrine, nutritional and metabolic diseases complicating pregnancy, unspecified trimester: Secondary | ICD-10-CM

## 2017-08-11 DIAGNOSIS — O99281 Endocrine, nutritional and metabolic diseases complicating pregnancy, first trimester: Secondary | ICD-10-CM

## 2017-08-11 DIAGNOSIS — O99282 Endocrine, nutritional and metabolic diseases complicating pregnancy, second trimester: Secondary | ICD-10-CM | POA: Insufficient documentation

## 2017-08-11 MED ORDER — ONDANSETRON 4 MG PO TBDP: 4.0000 mg | ORAL_TABLET | Freq: Four times a day (QID) | ORAL | 0 refills | Status: DC | PRN

## 2017-08-11 NOTE — Progress Notes (Signed)
  Subjective:    Katherine Shaw is being seen today for her first obstetrical visit. LMP 06/02/2017 (light different thatn normal only lasted 3 days as opposed to 7)   LNMP 10/10-15/2018  [id number] (s/p twin gestation 2012).  This is a planned pregnancy. She is at 2931w0d gestation. Her obstetrical history is significant for smoker. Relationship with FOB: spouse, living together. Patient does intend to breast feed. Pregnancy history fully reviewed. Stopped smoking <1 month prev. Prev THC and ETOH- none in pregnancy.    Patient reports nausea and vomiting. Pt reports emesis daily.   Review of Systems:   Review of Systems Corn  Objective:    BP (!) 109/58   Pulse 82   Wt 241 lb (109.3 kg)   LMP 06/02/2017   BMI 40.10 kg/m  Physical Exam  Exam  General Appearance:    Alert, cooperative, no distress, appears stated age  Head:    Normocephalic, without obvious abnormality, atraumatic  Eyes:    conjunctiva/corneas clear, EOM's intact, both eyes  Ears:    Normal external ear canals, both ears  Nose:   Nares normal, septum midline, mucosa normal, no drainage    or sinus tenderness  Throat:   Lips, mucosa, and tongue normal; teeth and gums normal  Neck:   Supple, symmetrical, trachea midline, no adenopathy;    thyroid:  no enlargement/tenderness/nodules  Back:     Symmetric, no curvature, ROM normal, no CVA tenderness  Lungs:     Clear to auscultation bilaterally, respirations unlabored  Chest Wall:    No tenderness or deformity   Heart:    Regular rate and rhythm, S1 and S2 normal, no murmur, rub   or gallop  Breast Exam:    No tenderness, masses, or nipple abnormality  Abdomen:     Soft, non-tender, bowel sounds active all four quadrants,    no masses, no organomegaly  Genitalia:    Normal female without lesion, discharge or tenderness; uterus >10weeks     Extremities:   Extremities normal, atraumatic, no cyanosis or edema  Pulses:   2+ and symmetric all extremities  Skin:   Skin color,  texture, turgor normal, no rashes or lesions       Assessment:    Pregnancy: [id number]    Patient Active Problem List   Diagnosis Date Noted  . Supervision of normal pregnancy, antepartum 08/11/2017  . Maternal tobacco use, antepartum 08/11/2017  . Thyroid disease, antepartum 08/11/2017    Plan:     Initial labs drawn. Prenatal vitamins. Problem list reviewed and updated. AFP3 discussed: requested.  Requests Panorama next visit Role of ultrasound in pregnancy discussed; fetal survey: requested. Amniocentesis discussed: not indicated. Follow up in 4 weeks. 65% of 60 min visit spent on counseling and coordination of care.  zofran 4 mg po q 6 hours.   Katherine Shaw 08/11/2017

## 2017-08-11 NOTE — Progress Notes (Signed)
DATING AND VIABILITY SONOGRAM   Zachery ConchStephanie Barham is a 27 y.o. year old 124P2012 with LMP Patient's last menstrual period was 06/02/2017. which would correlate to  8778w0d weeks gestation.  She has regular menstrual cycles.   She is here today for a confirmatory initial sonogram.    GESTATION: SINGLETON    FETAL ACTIVITY:          Heart rate         162          The fetus is active.  PLACENTA LOCALIZATION:  fundal      ADNEXA: The ovaries are normal.   GESTATIONAL AGE AND  BIOMETRICS:  Gestational criteria: Estimated Date of Delivery: 03/16/18 by early ultrasound now at 4478w0d  Previous Scans:1      CROWN RUMP LENGTH          2.37 mm        9 weeks                                                                               AVERAGE EGA(BY THIS SCAN):  9-0 weeks  WORKING EDD( early ultrasound ):  03-16-18     Armandina StammerJennifer Howard 08/11/2017 3:04 PM

## 2017-08-13 LAB — OBSTETRIC PANEL, INCLUDING HIV
Antibody Screen: NEGATIVE
BASOS ABS: 0 10*3/uL (ref 0.0–0.2)
Basos: 0 %
EOS (ABSOLUTE): 0.1 10*3/uL (ref 0.0–0.4)
EOS: 2 %
HEP B S AG: NEGATIVE
HIV Screen 4th Generation wRfx: NONREACTIVE
Hematocrit: 34.9 % (ref 34.0–46.6)
Hemoglobin: 11.9 g/dL (ref 11.1–15.9)
IMMATURE GRANS (ABS): 0 10*3/uL (ref 0.0–0.1)
IMMATURE GRANULOCYTES: 0 %
Lymphocytes Absolute: 1.2 10*3/uL (ref 0.7–3.1)
Lymphs: 24 %
MCH: 30.6 pg (ref 26.6–33.0)
MCHC: 34.1 g/dL (ref 31.5–35.7)
MCV: 90 fL (ref 79–97)
MONOCYTES: 7 %
Monocytes Absolute: 0.4 10*3/uL (ref 0.1–0.9)
NEUTROS PCT: 67 %
Neutrophils Absolute: 3.3 10*3/uL (ref 1.4–7.0)
PLATELETS: 216 10*3/uL (ref 150–379)
RBC: 3.89 x10E6/uL (ref 3.77–5.28)
RDW: 15.9 % — ABNORMAL HIGH (ref 12.3–15.4)
RH TYPE: POSITIVE
RPR: NONREACTIVE
RUBELLA: 16.6 {index} (ref >0.99)
WBC: 5 10*3/uL (ref 3.4–10.8)

## 2017-08-13 LAB — HEMOGLOBIN A1C
ESTIMATED AVERAGE GLUCOSE: 100 mg/dL
Hgb A1c MFr Bld: 5.1 % (ref 4.8–5.6)

## 2017-08-13 LAB — HEMOGLOBINOPATHY EVALUATION
HEMOGLOBIN A2 QUANTITATION: 3.5 % — AB (ref 1.8–3.2)
HEMOGLOBIN F QUANTITATION: 1.1 % (ref 0.0–2.0)
HGB A: 57.2 % — AB (ref 96.4–98.8)
HGB C: 0 %
HGB S: 38.2 % — AB
HGB VARIANT: 0 %

## 2017-08-13 LAB — CYTOLOGY - PAP
BACTERIAL VAGINITIS: NEGATIVE
CHLAMYDIA, DNA PROBE: NEGATIVE
Candida vaginitis: POSITIVE — AB
Diagnosis: NEGATIVE
NEISSERIA GONORRHEA: NEGATIVE

## 2017-08-13 LAB — TSH: TSH: 5.52 u[IU]/mL — ABNORMAL HIGH (ref 0.450–4.500)

## 2017-08-14 LAB — CULTURE, URINE COMPREHENSIVE

## 2017-09-02 ENCOUNTER — Encounter: Payer: Self-pay | Admitting: Obstetrics & Gynecology

## 2017-09-02 DIAGNOSIS — D573 Sickle-cell trait: Secondary | ICD-10-CM | POA: Insufficient documentation

## 2017-09-03 ENCOUNTER — Telehealth: Payer: Self-pay

## 2017-09-03 NOTE — Telephone Encounter (Signed)
Left message for patient to return call to office.  Jennifer Howard RNBSN 

## 2017-09-03 NOTE — Telephone Encounter (Signed)
-----   Message from Willodean Rosenthalarolyn Harraway-Smith, MD sent at 09/02/2017  9:17 PM EST ----- Please call pt. She needs a T3 and free T4.   We also need to know if the FOB has been tested for sickle cell. Is he a carrier?  Thx, clh-S

## 2017-09-16 ENCOUNTER — Ambulatory Visit (INDEPENDENT_AMBULATORY_CARE_PROVIDER_SITE_OTHER): Payer: Medicaid Other | Admitting: Family Medicine

## 2017-09-16 VITALS — BP 103/57 | HR 82 | Wt 242.0 lb

## 2017-09-16 DIAGNOSIS — O99280 Endocrine, nutritional and metabolic diseases complicating pregnancy, unspecified trimester: Secondary | ICD-10-CM

## 2017-09-16 DIAGNOSIS — Z3480 Encounter for supervision of other normal pregnancy, unspecified trimester: Secondary | ICD-10-CM

## 2017-09-16 DIAGNOSIS — E079 Disorder of thyroid, unspecified: Secondary | ICD-10-CM

## 2017-09-16 DIAGNOSIS — Z3490 Encounter for supervision of normal pregnancy, unspecified, unspecified trimester: Secondary | ICD-10-CM

## 2017-09-16 DIAGNOSIS — O99330 Smoking (tobacco) complicating pregnancy, unspecified trimester: Secondary | ICD-10-CM

## 2017-09-16 DIAGNOSIS — D573 Sickle-cell trait: Secondary | ICD-10-CM

## 2017-09-16 NOTE — Patient Instructions (Addendum)
B6 25mg  every 6hours Unisom 25mg  at bedtime, 12.5mg  (1/2 tab) in AM if needed - caution with sleepiness  [facility] [address] [place], [place] [address] [phone] Toll Free: [phone]   Testing on Thursdays

## 2017-09-16 NOTE — Progress Notes (Signed)
   PRENATAL VISIT NOTE  Subjective:  Katherine Shaw is a 27 y.o. [id number] at 2276w1d being seen today for ongoing prenatal care.  She is currently monitored for the following issues for this high-risk pregnancy and has Supervision of normal pregnancy, antepartum; Maternal tobacco use, antepartum; Thyroid disease, antepartum; and Sickle cell trait (HCC) on their problem list.  Patient reports nausea and vomiting - improving.  Contractions: Not present. Vag. Bleeding: None.   . Denies leaking of fluid.   The following portions of the patient's history were reviewed and updated as appropriate: allergies, current medications, past family history, past medical history, past social history, past surgical history and problem list. Problem list updated.  Objective:   Vitals:   09/16/17 0812  BP: (!) 103/57  Pulse: 82  Weight: 242 lb (109.8 kg)    Fetal Status: Fetal Heart Rate (bpm): 135         General:  Alert, oriented and cooperative. Patient is in no acute distress.  Skin: Skin is warm and dry. No rash noted.   Cardiovascular: Normal heart rate noted  Respiratory: Normal respiratory effort, no problems with respiration noted  Abdomen: Soft, gravid, appropriate for gestational age.  Pain/Pressure: Absent     Pelvic: Cervical exam deferred        Extremities: Normal range of motion.  Edema: None  Mental Status:  Normal mood and affect. Normal behavior. Normal judgment and thought content.   Assessment and Plan:  Pregnancy: [id number] at 1076w1d  1. Prenatal care, antepartum FHT and FH normal - T3, free - T4, free - US MFM OB DETAIL +14 WK; Future  2. Supervision of other normal pregnancy, antepartum Vit B6 and unisom recommended. Discussed somnolence with Unisom  3. Thyroid disease, antepartum TSH elevated, check T3/4  4. Sickle cell trait (HCC) Advised FOB to get tested. [facility]  5. Maternal tobacco use, antepartum   Preterm labor  symptoms and general obstetric precautions including but not limited to vaginal bleeding, contractions, leaking of fluid and fetal movement were reviewed in detail with the patient. Please refer to After Visit Summary for other counseling recommendations.  Return in about 4 weeks (around 10/14/2017).   Levie HeritageJacob J Zenas Santa, DO

## 2017-09-17 ENCOUNTER — Encounter: Payer: Self-pay | Admitting: Family Medicine

## 2017-09-17 LAB — T3, FREE: T3, Free: 3.5 pg/mL (ref 2.0–4.4)

## 2017-09-17 LAB — T4, FREE: Free T4: 1.24 ng/dL (ref 0.82–1.77)

## 2017-10-11 ENCOUNTER — Ambulatory Visit (INDEPENDENT_AMBULATORY_CARE_PROVIDER_SITE_OTHER): Payer: Medicaid Other | Admitting: Family Medicine

## 2017-10-11 VITALS — BP 102/58 | HR 79 | Wt 247.0 lb

## 2017-10-11 DIAGNOSIS — O99280 Endocrine, nutritional and metabolic diseases complicating pregnancy, unspecified trimester: Secondary | ICD-10-CM

## 2017-10-11 DIAGNOSIS — Z3480 Encounter for supervision of other normal pregnancy, unspecified trimester: Secondary | ICD-10-CM

## 2017-10-11 DIAGNOSIS — D573 Sickle-cell trait: Secondary | ICD-10-CM

## 2017-10-11 DIAGNOSIS — E079 Disorder of thyroid, unspecified: Secondary | ICD-10-CM

## 2017-10-11 DIAGNOSIS — Z3490 Encounter for supervision of normal pregnancy, unspecified, unspecified trimester: Secondary | ICD-10-CM

## 2017-10-11 NOTE — Progress Notes (Signed)
   PRENATAL VISIT NOTE  Subjective:  Katherine Shaw is a 27 y.o. [phone] at 7468w5d being seen today for ongoing prenatal care.  She is currently monitored for the following issues for this high-risk pregnancy and has Supervision of normal pregnancy, antepartum; Maternal tobacco use, antepartum; Thyroid disease, antepartum; and Sickle cell trait (HCC) on their problem list.  Patient reports nausea and vomiting. Drinking ensure. Not able to eat solids yet. Hasn't been able to get zofran because insurance in maiden name.  Contractions: Not present. Vag. Bleeding: None.  Movement: Present. Denies leaking of fluid.   The following portions of the patient's history were reviewed and updated as appropriate: allergies, current medications, past family history, past medical history, past social history, past surgical history and problem list. Problem list updated.  Objective:   Vitals:   10/11/17 0839  BP: (!) 102/58  Pulse: 79  Weight: 247 lb (112 kg)    Fetal Status: Fetal Heart Rate (bpm): 140   Movement: Present     General:  Alert, oriented and cooperative. Patient is in no acute distress.  Skin: Skin is warm and dry. No rash noted.   Cardiovascular: Normal heart rate noted  Respiratory: Normal respiratory effort, no problems with respiration noted  Abdomen: Soft, gravid, appropriate for gestational age.  Pain/Pressure: Absent     Pelvic: Cervical exam deferred        Extremities: Normal range of motion.  Edema: None  Mental Status:  Normal mood and affect. Normal behavior. Normal judgment and thought content.   Assessment and Plan:  Pregnancy: [id number] at 4168w5d  1. Supervision of other normal pregnancy, antepartum FHT and FH normal. Lemon heads for Ptyalism. May need Rubinol if not improved with zofran  2. Sickle cell trait (HCC) FOB has not been tested yet. Discussed importance of testing  3. Thyroid disease, antepartum Recheck TSH next visit  4. Prenatal care,  antepartum   Preterm labor symptoms and general obstetric precautions including but not limited to vaginal bleeding, contractions, leaking of fluid and fetal movement were reviewed in detail with the patient. Please refer to After Visit Summary for other counseling recommendations.  No Follow-up on file.   Levie HeritageJacob J Stinson, DO

## 2017-10-14 ENCOUNTER — Encounter (HOSPITAL_COMMUNITY): Payer: Self-pay | Admitting: Family Medicine

## 2017-10-20 ENCOUNTER — Other Ambulatory Visit: Payer: Self-pay | Admitting: Family Medicine

## 2017-10-20 ENCOUNTER — Other Ambulatory Visit (HOSPITAL_COMMUNITY): Payer: Self-pay | Admitting: *Deleted

## 2017-10-20 ENCOUNTER — Ambulatory Visit (HOSPITAL_COMMUNITY)
Admission: RE | Admit: 2017-10-20 | Discharge: 2017-10-20 | Disposition: A | Discharge status: Home / Self Care | Payer: Medicaid Other | Source: Ambulatory Visit | Attending: Family Medicine | Admitting: Family Medicine

## 2017-10-20 ENCOUNTER — Encounter (HOSPITAL_COMMUNITY): Payer: Self-pay

## 2017-10-20 DIAGNOSIS — D573 Sickle-cell trait: Secondary | ICD-10-CM

## 2017-10-20 DIAGNOSIS — E039 Hypothyroidism, unspecified: Secondary | ICD-10-CM | POA: Insufficient documentation

## 2017-10-20 DIAGNOSIS — Z3A20 20 weeks gestation of pregnancy: Secondary | ICD-10-CM | POA: Diagnosis not present

## 2017-10-20 DIAGNOSIS — Z363 Encounter for antenatal screening for malformations: Secondary | ICD-10-CM | POA: Diagnosis present

## 2017-10-20 DIAGNOSIS — O99212 Obesity complicating pregnancy, second trimester: Secondary | ICD-10-CM | POA: Diagnosis not present

## 2017-10-20 DIAGNOSIS — O99282 Endocrine, nutritional and metabolic diseases complicating pregnancy, second trimester: Secondary | ICD-10-CM | POA: Diagnosis not present

## 2017-10-20 DIAGNOSIS — Z3490 Encounter for supervision of normal pregnancy, unspecified, unspecified trimester: Secondary | ICD-10-CM

## 2017-10-20 DIAGNOSIS — Z362 Encounter for other antenatal screening follow-up: Secondary | ICD-10-CM

## 2017-10-20 DIAGNOSIS — Z3A19 19 weeks gestation of pregnancy: Secondary | ICD-10-CM

## 2017-11-16 ENCOUNTER — Other Ambulatory Visit (HOSPITAL_COMMUNITY)
Admission: RE | Admit: 2017-11-16 | Discharge: 2017-11-16 | Disposition: A | Discharge status: Home / Self Care | Payer: Medicaid Other | Source: Ambulatory Visit | Attending: Obstetrics & Gynecology | Admitting: Obstetrics & Gynecology

## 2017-11-16 ENCOUNTER — Ambulatory Visit (INDEPENDENT_AMBULATORY_CARE_PROVIDER_SITE_OTHER): Payer: Medicaid Other | Admitting: Advanced Practice Midwife

## 2017-11-16 VITALS — BP 109/73 | HR 86 | Wt 247.0 lb

## 2017-11-16 DIAGNOSIS — N898 Other specified noninflammatory disorders of vagina: Secondary | ICD-10-CM | POA: Insufficient documentation

## 2017-11-16 DIAGNOSIS — E079 Disorder of thyroid, unspecified: Secondary | ICD-10-CM

## 2017-11-16 DIAGNOSIS — O99280 Endocrine, nutritional and metabolic diseases complicating pregnancy, unspecified trimester: Secondary | ICD-10-CM

## 2017-11-16 DIAGNOSIS — Z3480 Encounter for supervision of other normal pregnancy, unspecified trimester: Secondary | ICD-10-CM

## 2017-11-16 MED ORDER — TERCONAZOLE 0.4 % VA CREA: 1.0000 | TOPICAL_CREAM | Freq: Every day | VAGINAL | 0 refills | Status: DC

## 2017-11-16 NOTE — Patient Instructions (Addendum)
Second Trimester of Pregnancy The second trimester is from week 14 through week 27 (months 4 through 6). The second trimester is often a time when you feel your best. Your body has adjusted to being pregnant, and you begin to feel better physically. Usually, morning sickness has lessened or quit completely, you may have more energy, and you may have an increase in appetite. The second trimester is also a time when the fetus is growing rapidly. At the end of the sixth month, the fetus is about 9 inches long and weighs about 1 pounds. You will likely begin to feel the baby move (quickening) between 16 and 20 weeks of pregnancy. Body changes during your second trimester Your body continues to go through many changes during your second trimester. The changes vary from woman to woman.  Your weight will continue to increase. You will notice your lower abdomen bulging out.  You may begin to get stretch marks on your hips, abdomen, and breasts.  You may develop headaches that can be relieved by medicines. The medicines should be approved by your health care provider.  You may urinate more often because the fetus is pressing on your bladder.  You may develop or continue to have heartburn as a result of your pregnancy.  You may develop constipation because certain hormones are causing the muscles that push waste through your intestines to slow down.  You may develop hemorrhoids or swollen, bulging veins (varicose veins).  You may have back pain. This is caused by: ? Weight gain. ? Pregnancy hormones that are relaxing the joints in your pelvis. ? A shift in weight and the muscles that support your balance.  Your breasts will continue to grow and they will continue to become tender.  Your gums may bleed and may be sensitive to brushing and flossing.  Dark spots or blotches (chloasma, mask of pregnancy) may develop on your face. This will likely fade after the baby is born.  A dark line from your  belly button to the pubic area (linea nigra) may appear. This will likely fade after the baby is born.  You may have changes in your hair. These can include thickening of your hair, rapid growth, and changes in texture. Some women also have hair loss during or after pregnancy, or hair that feels dry or thin. Your hair will most likely return to normal after your baby is born.  What to expect at prenatal visits During a routine prenatal visit:  You will be weighed to make sure you and the fetus are growing normally.  Your blood pressure will be taken.  Your abdomen will be measured to track your baby's growth.  The fetal heartbeat will be listened to.  Any test results from the previous visit will be discussed.  Your health care provider may ask you:  How you are feeling.  If you are feeling the baby move.  If you have had any abnormal symptoms, such as leaking fluid, bleeding, severe headaches, or abdominal cramping.  If you are using any tobacco products, including cigarettes, chewing tobacco, and electronic cigarettes.  If you have any questions.  Other tests that may be performed during your second trimester include:  Blood tests that check for: ? Low iron levels (anemia). ? High blood sugar that affects pregnant women (gestational diabetes) between 24 and 28 weeks. ? Rh antibodies. This is to check for a protein on red blood cells (Rh factor).  Urine tests to check for infections, diabetes, or   protein in the urine.  An ultrasound to confirm the proper growth and development of the baby.  An amniocentesis to check for possible genetic problems.  Fetal screens for spina bifida and Down syndrome.  HIV (human immunodeficiency virus) testing. Routine prenatal testing includes screening for HIV, unless you choose not to have this test.  Follow these instructions at home: Medicines  Follow your health care provider's instructions regarding medicine use. Specific  medicines may be either safe or unsafe to take during pregnancy.  Take a prenatal vitamin that contains at least 600 micrograms (mcg) of folic acid.  If you develop constipation, try taking a stool softener if your health care provider approves. Eating and drinking  Eat a balanced diet that includes fresh fruits and vegetables, whole grains, good sources of protein such as meat, eggs, or tofu, and low-fat dairy. Your health care provider will help you determine the amount of weight gain that is right for you.  Avoid raw meat and uncooked cheese. These carry germs that can cause birth defects in the baby.  If you have low calcium intake from food, talk to your health care provider about whether you should take a daily calcium supplement.  Limit foods that are high in fat and processed sugars, such as fried and sweet foods.  To prevent constipation: ? Drink enough fluid to keep your urine clear or pale yellow. ? Eat foods that are high in fiber, such as fresh fruits and vegetables, whole grains, and beans. Activity  Exercise only as directed by your health care provider. Most women can continue their usual exercise routine during pregnancy. Try to exercise for 30 minutes at least 5 days a week. Stop exercising if you experience uterine contractions.  Avoid heavy lifting, wear low heel shoes, and practice good posture.  A sexual relationship may be continued unless your health care provider directs you otherwise. Relieving pain and discomfort  Wear a good support bra to prevent discomfort from breast tenderness.  Take warm sitz baths to soothe any pain or discomfort caused by hemorrhoids. Use hemorrhoid cream if your health care provider approves.  Rest with your legs elevated if you have leg cramps or low back pain.  If you develop varicose veins, wear support hose. Elevate your feet for 15 minutes, 3-4 times a day. Limit salt in your diet. Prenatal Care  Write down your questions.  Take them to your prenatal visits.  Keep all your prenatal visits as told by your health care provider. This is important. Safety  Wear your seat belt at all times when driving.  Make a list of emergency phone numbers, including numbers for family, friends, the hospital, and police and fire departments. General instructions  Ask your health care provider for a referral to a local prenatal education class. Begin classes no later than the beginning of month 6 of your pregnancy.  Ask for help if you have counseling or nutritional needs during pregnancy. Your health care provider can offer advice or refer you to specialists for help with various needs.  Do not use hot tubs, steam rooms, or saunas.  Do not douche or use tampons or scented sanitary pads.  Do not cross your legs for long periods of time.  Avoid cat litter boxes and soil used by cats. These carry germs that can cause birth defects in the baby and possibly loss of the fetus by miscarriage or stillbirth.  Avoid all smoking, herbs, alcohol, and unprescribed drugs. Chemicals in these products can   affect the formation and growth of the baby.  Do not use any products that contain nicotine or tobacco, such as cigarettes and e-cigarettes. If you need help quitting, ask your health care provider.  Visit your dentist if you have not gone yet during your pregnancy. Use a soft toothbrush to brush your teeth and be gentle when you floss. Contact a health care provider if:  You have dizziness.  You have mild pelvic cramps, pelvic pressure, or nagging pain in the abdominal area.  You have persistent nausea, vomiting, or diarrhea.  You have a bad smelling vaginal discharge.  You have pain when you urinate. Get help right away if:  You have a fever.  You are leaking fluid from your vagina.  You have spotting or bleeding from your vagina.  You have severe abdominal cramping or pain.  You have rapid weight gain or weight  loss.  You have shortness of breath with chest pain.  You notice sudden or extreme swelling of your face, hands, ankles, feet, or legs.  You have not felt your baby move in over an hour.  You have severe headaches that do not go away when you take medicine.  You have vision changes. Summary  The second trimester is from week 14 through week 27 (months 4 through 6). It is also a time when the fetus is growing rapidly.  Your body goes through many changes during pregnancy. The changes vary from woman to woman.  Avoid all smoking, herbs, alcohol, and unprescribed drugs. These chemicals affect the formation and growth your baby.  Do not use any tobacco products, such as cigarettes, chewing tobacco, and e-cigarettes. If you need help quitting, ask your health care provider.  Contact your health care provider if you have any questions. Keep all prenatal visits as told by your health care provider. This is important. This information is not intended to replace advice given to you by your health care provider. Make sure you discuss any questions you have with your health care provider. Document Released: 07/07/2001 Document Revised: 08/18/2016 Document Reviewed: 08/18/2016 Elsevier Interactive Patient Education  2018 [employer]   Vaginal Yeast infection, Adult Vaginal yeast infection is a condition that causes soreness, swelling, and redness (inflammation) of the vagina. It also causes vaginal discharge. This is a common condition. Some women get this infection frequently. What are the causes? This condition is caused by a change in the normal balance of the yeast (candida) and bacteria that live in the vagina. This change causes an overgrowth of yeast, which causes the inflammation. What increases the risk? This condition is more likely to develop in:  Women who take antibiotic medicines.  Women who have diabetes.  Women who take birth control pills.  Women who are  pregnant.  Women who douche often.  Women who have a weak defense (immune) system.  Women who have been taking steroid medicines for a long time.  Women who frequently wear tight clothing.  What are the signs or symptoms? Symptoms of this condition include:  White, thick vaginal discharge.  Swelling, itching, redness, and irritation of the vagina. The lips of the vagina (vulva) may be affected as well.  Pain or a burning feeling while urinating.  Pain during sex.  How is this diagnosed? This condition is diagnosed with a medical history and physical exam. This will include a pelvic exam. Your health care provider will examine a sample of your vaginal discharge under a microscope. Your health care provider may   send this sample for testing to confirm the diagnosis. How is this treated? This condition is treated with medicine. Medicines may be over-the-counter or prescription. You may be told to use one or more of the following:  Medicine that is taken orally.  Medicine that is applied as a cream.  Medicine that is inserted directly into the vagina (suppository).  Follow these instructions at home:  Take or apply over-the-counter and prescription medicines only as told by your health care provider.  Do not have sex until your health care provider has approved. Tell your sex partner that you have a yeast infection. That person should go to his or her health care provider if he or she develops symptoms.  Do not wear tight clothes, such as pantyhose or tight pants.  Avoid using tampons until your health care provider approves.  Eat more yogurt. This may help to keep your yeast infection from returning.  Try taking a sitz bath to help with discomfort. This is a warm water bath that is taken while you are sitting down. The water should only come up to your hips and should cover your buttocks. Do this 3-4 times per day or as told by your health care provider.  Do not  douche.  Wear breathable, cotton underwear.  If you have diabetes, keep your blood sugar levels under control. Contact a health care provider if:  You have a fever.  Your symptoms go away and then return.  Your symptoms do not get better with treatment.  Your symptoms get worse.  You have new symptoms.  You develop blisters in or around your vagina.  You have blood coming from your vagina and it is not your menstrual period.  You develop pain in your abdomen. This information is not intended to replace advice given to you by your health care provider. Make sure you discuss any questions you have with your health care provider. Document Released: 04/22/2005 Document Revised: 12/25/2015 Document Reviewed: 01/14/2015 Elsevier Interactive Patient Education  2018 [employer]   

## 2017-11-16 NOTE — Progress Notes (Signed)
PT c/o yeast infection 

## 2017-11-16 NOTE — Progress Notes (Signed)
   PRENATAL VISIT NOTE  Subjective:  Katherine Shaw is a 27 y.o. G3P1012 at 5034w6d being seen today for ongoing prenatal care.  Katherine Shaw for the following issues for this low-risk pregnancy and has Supervision of normal pregnancy, antepartum; Maternal tobacco use, antepartum; Thyroid disease, antepartum; and Sickle cell trait (HCC) on their problem list.  Patient reports no complaints.  Contractions: Not present. Vag. Bleeding: None.  Movement: Present. Denies leaking of fluid.   The following portions of the patient's history were reviewed and updated as appropriate: allergies, current medications, past family history, past medical history, past social history, past surgical history and problem list. Problem list updated.  Objective:   Vitals:   11/16/17 0826  BP: 109/73  Pulse: 86  Weight: 247 lb (112 kg)    Fetal Status: Fetal Heart Rate (bpm): 142   Movement: Present     General:  Alert, oriented and cooperative. Patient is in no acute distress.  Skin: Skin is warm and dry. No rash noted.   Cardiovascular: Normal heart rate noted  Respiratory: Normal respiratory effort, no problems with respiration noted  Abdomen: Soft, gravid, appropriate for gestational age.  Pain/Pressure: Absent     Pelvic: Cervicovaginal swab collected as blind swab, visual inspection reveals flaky skin on labia, groin area with thin white discharge at introitus        Extremities: Normal range of motion.  Edema: None  Mental Status: Normal mood and affect. Normal behavior. Normal judgment and thought content.   Assessment and Plan:  Pregnancy: G3P1012 at 2434w6d  1. Supervision of other normal pregnancy, antepartum   2. Thyroid disease, antepartum --Repeat labs in second trimester - TSH  3. Vaginal irritation --Pt report of increased discharge and irritation in vaginal area and inner thighs, no odor/itching/burning --clinical exam c/w candida, so will treat with Terazol 7 -  Cervicovaginal ancillary only  Preterm labor symptoms and general obstetric precautions including but not limited to vaginal bleeding, contractions, leaking of fluid and fetal movement were reviewed in detail with the patient. Please refer to After Visit Summary for other counseling recommendations.  Return in about 1 month (around 12/14/2017).  Future Appointments  Date Time Provider Department Center  11/17/2017  9:00 AM [facility] US 3 [facility]    Sharen CounterLisa Leftwich-Kirby, CNM

## 2017-11-17 ENCOUNTER — Ambulatory Visit (HOSPITAL_COMMUNITY)
Admission: RE | Admit: 2017-11-17 | Discharge: 2017-11-17 | Disposition: A | Discharge status: Home / Self Care | Payer: Medicaid Other | Source: Ambulatory Visit | Attending: Advanced Practice Midwife | Admitting: Advanced Practice Midwife

## 2017-11-17 ENCOUNTER — Encounter (HOSPITAL_COMMUNITY): Payer: Self-pay

## 2017-11-17 ENCOUNTER — Other Ambulatory Visit (HOSPITAL_COMMUNITY): Payer: Self-pay | Admitting: *Deleted

## 2017-11-17 DIAGNOSIS — Z3A24 24 weeks gestation of pregnancy: Secondary | ICD-10-CM | POA: Insufficient documentation

## 2017-11-17 DIAGNOSIS — O99282 Endocrine, nutritional and metabolic diseases complicating pregnancy, second trimester: Secondary | ICD-10-CM | POA: Insufficient documentation

## 2017-11-17 DIAGNOSIS — Z362 Encounter for other antenatal screening follow-up: Secondary | ICD-10-CM | POA: Diagnosis present

## 2017-11-17 DIAGNOSIS — E039 Hypothyroidism, unspecified: Secondary | ICD-10-CM | POA: Diagnosis not present

## 2017-11-17 DIAGNOSIS — O99212 Obesity complicating pregnancy, second trimester: Secondary | ICD-10-CM | POA: Insufficient documentation

## 2017-11-17 DIAGNOSIS — O99283 Endocrine, nutritional and metabolic diseases complicating pregnancy, third trimester: Principal | ICD-10-CM

## 2017-11-17 LAB — CERVICOVAGINAL ANCILLARY ONLY
BACTERIAL VAGINITIS: NEGATIVE
Candida vaginitis: POSITIVE — AB

## 2017-11-17 LAB — TSH: TSH: 5.76 u[IU]/mL — ABNORMAL HIGH (ref 0.450–4.500)

## 2017-11-30 ENCOUNTER — Encounter: Payer: Self-pay | Admitting: Advanced Practice Midwife

## 2017-11-30 ENCOUNTER — Ambulatory Visit (INDEPENDENT_AMBULATORY_CARE_PROVIDER_SITE_OTHER): Payer: Medicaid Other | Admitting: Advanced Practice Midwife

## 2017-11-30 VITALS — BP 92/57 | HR 66 | Wt 250.0 lb

## 2017-11-30 DIAGNOSIS — Z3480 Encounter for supervision of other normal pregnancy, unspecified trimester: Secondary | ICD-10-CM

## 2017-11-30 DIAGNOSIS — Z23 Encounter for immunization: Secondary | ICD-10-CM | POA: Diagnosis not present

## 2017-11-30 DIAGNOSIS — O358XX0 Maternal care for other (suspected) fetal abnormality and damage, not applicable or unspecified: Secondary | ICD-10-CM

## 2017-11-30 DIAGNOSIS — O35EXX0 Maternal care for other (suspected) fetal abnormality and damage, fetal genitourinary anomalies, not applicable or unspecified: Secondary | ICD-10-CM

## 2017-11-30 DIAGNOSIS — Z3482 Encounter for supervision of other normal pregnancy, second trimester: Secondary | ICD-10-CM

## 2017-11-30 DIAGNOSIS — E039 Hypothyroidism, unspecified: Secondary | ICD-10-CM

## 2017-11-30 DIAGNOSIS — O99282 Endocrine, nutritional and metabolic diseases complicating pregnancy, second trimester: Secondary | ICD-10-CM

## 2017-11-30 NOTE — Patient Instructions (Addendum)
[email or web address]  TDaP Vaccine Pregnancy Get the Whooping Cough Vaccine While You Are Pregnant (CDC)  It is important for women to get the whooping cough vaccine in the third trimester of each pregnancy. Vaccines are the best way to prevent this disease. There are 2 different whooping cough vaccines. Both vaccines combine protection against whooping cough, tetanus and diphtheria, but they are for different age groups: Tdap: for everyone 11 years or older, including pregnant women  DTaP: for children 2 months through 51 years of age  You need the whooping cough vaccine during each of your pregnancies The recommended time to get the shot is during your 27th through 36th week of pregnancy, preferably during the earlier part of this time period. The Centers for Disease Control and Prevention (CDC) recommends that pregnant women receive the whooping cough vaccine for adolescents and adults (called Tdap vaccine) during the third trimester of each pregnancy. The recommended time to get the shot is during your 27th through 36th week of pregnancy, preferably during the earlier part of this time period. This replaces the original recommendation that pregnant women get the vaccine only if they had not previously received it. The [employer] of [employer]necologists and the [employer] support this recommendation.  You should get the whooping cough vaccine while pregnant to pass protection to your baby frame support disabled and/or not supported in this browser  Learn why Katherine Shaw decided to get the whooping cough vaccine in her 3rd trimester of pregnancy and how her baby girl was born with some protection against the disease. Also available on YouTube. After receiving the whooping cough vaccine, your body will create protective antibodies (proteins produced by the body to fight off diseases) and pass some of them to your baby before birth. These antibodies provide your  baby some short-term protection against whooping cough in early life. These antibodies can also protect your baby from some of the more serious complications that come along with whooping cough. Your protective antibodies are at their highest about 2 weeks after getting the vaccine, but it takes time to pass them to your baby. So the preferred time to get the whooping cough vaccine is early in your third trimester. The amount of whooping cough antibodies in your body decreases over time. That is why CDC recommends you get a whooping cough vaccine during each pregnancy. Doing so allows each of your babies to get the greatest number of protective antibodies from you. This means each of your babies will get the best protection possible against this disease.  Getting the whooping cough vaccine while pregnant is better than getting the vaccine after you give birth Whooping cough vaccination during pregnancy is ideal so your baby will have short-term protection as soon as he is born. This early protection is important because your baby will not start getting his whooping cough vaccines until he is 2 months old. These first few months of life are when your baby is at greatest risk for catching whooping cough. This is also when he's at greatest risk for having severe, potentially life-threating complications from the infection. To avoid that gap in protection, it is best to get a whooping cough vaccine during pregnancy. You will then pass protection to your baby before he is born. To continue protecting your baby, he should get whooping cough vaccines starting at 2 months old. You may never have gotten the Tdap vaccine before and did not get it during this pregnancy. If so, you should  make sure to get the vaccine immediately after you give birth, before leaving the hospital or birthing center. It will take about 2 weeks before your body develops protection (antibodies) in response to the vaccine. Once you have  protection from the vaccine, you are less likely to give whooping cough to your newborn while caring for him. But remember, your baby will still be at risk for catching whooping cough from others. A recent study looked to see how effective Tdap was at preventing whooping cough in babies whose mothers got the vaccine while pregnant or in the hospital after giving birth. The study found that getting Tdap between 27 through 36 weeks of pregnancy is 85% more effective at preventing whooping cough in babies younger than 2 months old. Blood tests cannot tell if you need a whooping cough vaccine There are no blood tests that can tell you if you have enough antibodies in your body to protect yourself or your baby against whooping cough. Even if you have been sick with whooping cough in the past or previously received the vaccine, you still should get the vaccine during each pregnancy. Breastfeeding may pass some protective antibodies onto your baby By breastfeeding, you may pass some antibodies you have made in response to the vaccine to your baby. When you get a whooping cough vaccine during your pregnancy, you will have antibodies in your breast milk that you can share with your baby as soon as your milk comes in. However, your baby will not get protective antibodies immediately if you wait to get the whooping cough vaccine until after delivering your baby. This is because it takes about 2 weeks for your body to create antibodies. Learn more about the health benefits of breastfeeding.   Places to have your son circumcised:    [facility] [phone] while you are in hospital  [facility] [phone] $244 by 4 wks  [facility] [phone] $175 by 2 wks  [facility] [phone] $250 by 7 days [facility] [phone] $269 by 4 wks  These prices  sometimes change but are roughly what you can expect to pay. Please call and confirm pricing.   Circumcision is considered an elective/non-medically necessary procedure. There are many reasons parents decide to have their sons circumsized. During the first year of life circumcised males have a reduced risk of urinary tract infections but after this year the rates between circumcised males and uncircumcised males are the same.  It is safe to have your son circumcised outside of the hospital and the places above perform them regularly.   Deciding about Circumcision in Baby Boys  (Up-to-date The Basics)  What is circumcision?  Circumcision is a surgery that removes the skin that covers the tip of the penis, called the "foreskin" Circumcision is usually done when a boy is between 67 and 57 days old. In the [place], circumcision is common. In some other countries, fewer boys are circumcised. Circumcision is a common tradition in some religions.  Should I have my baby boy circumcised?  There is no easy answer. Circumcision has some benefits. But it also has risks. After talking with your doctor, you will have to decide for yourself what is right for your family.  What are the benefits of circumcision?  Circumcised boys seem to have slightly lower rates of: ?Urinary tract infections ?Swelling of the opening at the tip of the penis Circumcised men seem to have slightly lower rates of: ?Urinary tract infections ?Swelling of the opening at the tip of  the penis ?Penis cancer ?HIV and other infections that you catch during sex ?Cervical cancer in the women they have sex with Even so, in the [place], the risks of these problems are small - even in boys and men who have not been circumcised. Plus, boys and men who are not circumcised can reduce these extra risks by: ?Cleaning their penis well ?Using condoms during sex  What are the risks of circumcision?  Risks include: ?Bleeding or  infection from the surgery ?Damage to or amputation of the penis ?A chance that the doctor will cut off too much or not enough of the foreskin ?A chance that sex won't feel as good later in life Only about 1 out of every 200 circumcisions leads to problems. There is also a chance that your health insurance won't pay for circumcision.  How is circumcision done in baby boys?  First, the baby gets medicine for pain relief. This might be a cream on the skin or a shot into the base of the penis. Next, the doctor cleans the baby's penis well. Then he or she uses special tools to cut off the foreskin. Finally, the doctor wraps a bandage (called gauze) around the baby's penis. If you have your baby circumcised, his doctor or nurse will give you instructions on how to care for him after the surgery. It is important that you follow those instructions carefully.

## 2017-11-30 NOTE — Progress Notes (Signed)
   PRENATAL VISIT NOTE  Subjective:  Katherine Shaw is a 27 y.o. G3P1012 at [redacted]w[redacted]d being seen today for ongoing prenatal care.  She is currently monitored for the following issues for this high-risk pregnancy and has Supervision of normal pregnancy, antepartum; Maternal tobacco use, antepartum; Hypothyroidism affecting pregnancy in second trimester; Sickle cell trait (HCC); [redacted] weeks gestation of pregnancy; and Ganglion cyst of dorsum of left wrist on their problem list.  Patient reports no complaints.  Contractions: Not present. Vag. Bleeding: None.  Movement: Present. Denies leaking of fluid.   The following portions of the patient's history were reviewed and updated as appropriate: allergies, current medications, past family history, past medical history, past social history, past surgical history and problem list. Problem list updated.  Objective:   Vitals:   11/30/17 0852  BP: (!) 92/57  Pulse: 66  Weight: 250 lb (113.4 kg)    Fetal Status: Fetal Heart Rate (bpm): 150   Movement: Present     General:  Alert, oriented and cooperative. Patient is in no acute distress.  Skin: Skin is warm and dry. No rash noted.   Cardiovascular: Normal heart rate noted  Respiratory: Normal respiratory effort, no problems with respiration noted  Abdomen: Soft, gravid, appropriate for gestational age.  Pain/Pressure: Present     Pelvic: Cervical exam deferred        Extremities: Normal range of motion.  Edema: None  Mental Status: Normal mood and affect. Normal behavior. Normal judgment and thought content.   Assessment and Plan:  Pregnancy: G3P1012 at [redacted]w[redacted]d  1. Supervision of other normal pregnancy, antepartum  - Glucose Tolerance, 2 Hours w/1 Hour - RPR - HIV antibody (with reflex) - CBC  2. Hypothyroidism affecting pregnancy in second trimester--TSH elevated. Not on meds.   - T3 - T4, free  3. Xenia Trait - Discussed having FOB tested.  Preterm labor symptoms and general obstetric  precautions including but not limited to vaginal bleeding, contractions, leaking of fluid and fetal movement were reviewed in detail with the patient. Please refer to After Visit Summary for other counseling recommendations.  Return in about 2 weeks (around 12/14/2017) for ROB.  Future Appointments  Date Time Provider Department Center  12/29/2017  9:30 AM [facility] [place] 1 [facility]    Dorathy Kinsman, [place]

## 2017-12-01 LAB — CBC
HEMATOCRIT: 34.3 % (ref 34.0–46.6)
HEMOGLOBIN: 11.6 g/dL (ref 11.1–15.9)
MCH: 32.4 pg (ref 26.6–33.0)
MCHC: 33.8 g/dL (ref 31.5–35.7)
MCV: 96 fL (ref 79–97)
Platelets: 203 10*3/uL (ref 150–379)
RBC: 3.58 x10E6/uL — AB (ref 3.77–5.28)
RDW: 13.9 % (ref 12.3–15.4)
WBC: 8 10*3/uL (ref 3.4–10.8)

## 2017-12-01 LAB — T4, FREE: FREE T4: 0.95 ng/dL (ref 0.82–1.77)

## 2017-12-01 LAB — GLUCOSE TOLERANCE, 2 HOURS W/ 1HR
Glucose, 1 hour: 150 mg/dL (ref 65–179)
Glucose, 2 hour: 111 mg/dL (ref 65–152)
Glucose, Fasting: 87 mg/dL (ref 65–91)

## 2017-12-01 LAB — HIV ANTIBODY (ROUTINE TESTING W REFLEX): HIV SCREEN 4TH GENERATION: NONREACTIVE

## 2017-12-01 LAB — RPR: RPR: NONREACTIVE

## 2017-12-01 LAB — T3: T3 TOTAL: 202 ng/dL — AB (ref 71–180)

## 2017-12-07 DIAGNOSIS — O358XX0 Maternal care for other (suspected) fetal abnormality and damage, not applicable or unspecified: Secondary | ICD-10-CM | POA: Insufficient documentation

## 2017-12-07 DIAGNOSIS — O35EXX0 Maternal care for other (suspected) fetal abnormality and damage, fetal genitourinary anomalies, not applicable or unspecified: Secondary | ICD-10-CM | POA: Insufficient documentation

## 2017-12-08 ENCOUNTER — Telehealth: Payer: Self-pay

## 2017-12-08 ENCOUNTER — Encounter (HOSPITAL_COMMUNITY): Payer: Self-pay

## 2017-12-08 ENCOUNTER — Inpatient Hospital Stay (HOSPITAL_COMMUNITY)
Admission: AD | Admit: 2017-12-08 | Discharge: 2017-12-08 | Disposition: A | Discharge status: Home / Self Care | Payer: Medicaid Other | Source: Ambulatory Visit | Attending: Obstetrics and Gynecology | Admitting: Obstetrics and Gynecology

## 2017-12-08 ENCOUNTER — Other Ambulatory Visit: Payer: Self-pay

## 2017-12-08 DIAGNOSIS — O358XX0 Maternal care for other (suspected) fetal abnormality and damage, not applicable or unspecified: Secondary | ICD-10-CM

## 2017-12-08 DIAGNOSIS — O98512 Other viral diseases complicating pregnancy, second trimester: Secondary | ICD-10-CM | POA: Diagnosis not present

## 2017-12-08 DIAGNOSIS — O99342 Other mental disorders complicating pregnancy, second trimester: Secondary | ICD-10-CM | POA: Insufficient documentation

## 2017-12-08 DIAGNOSIS — O219 Vomiting of pregnancy, unspecified: Secondary | ICD-10-CM

## 2017-12-08 DIAGNOSIS — Z87891 Personal history of nicotine dependence: Secondary | ICD-10-CM | POA: Insufficient documentation

## 2017-12-08 DIAGNOSIS — Z3A27 27 weeks gestation of pregnancy: Secondary | ICD-10-CM | POA: Diagnosis not present

## 2017-12-08 DIAGNOSIS — R197 Diarrhea, unspecified: Secondary | ICD-10-CM

## 2017-12-08 DIAGNOSIS — O26899 Other specified pregnancy related conditions, unspecified trimester: Secondary | ICD-10-CM | POA: Diagnosis not present

## 2017-12-08 DIAGNOSIS — O99282 Endocrine, nutritional and metabolic diseases complicating pregnancy, second trimester: Secondary | ICD-10-CM

## 2017-12-08 DIAGNOSIS — D573 Sickle-cell trait: Secondary | ICD-10-CM

## 2017-12-08 DIAGNOSIS — A0811 Acute gastroenteropathy due to Norwalk agent: Secondary | ICD-10-CM | POA: Diagnosis not present

## 2017-12-08 DIAGNOSIS — F419 Anxiety disorder, unspecified: Secondary | ICD-10-CM | POA: Insufficient documentation

## 2017-12-08 DIAGNOSIS — O35EXX0 Maternal care for other (suspected) fetal abnormality and damage, fetal genitourinary anomalies, not applicable or unspecified: Secondary | ICD-10-CM

## 2017-12-08 DIAGNOSIS — F329 Major depressive disorder, single episode, unspecified: Secondary | ICD-10-CM | POA: Diagnosis not present

## 2017-12-08 DIAGNOSIS — O99330 Smoking (tobacco) complicating pregnancy, unspecified trimester: Secondary | ICD-10-CM

## 2017-12-08 DIAGNOSIS — E039 Hypothyroidism, unspecified: Secondary | ICD-10-CM

## 2017-12-08 DIAGNOSIS — Z3480 Encounter for supervision of other normal pregnancy, unspecified trimester: Secondary | ICD-10-CM

## 2017-12-08 LAB — URINALYSIS, ROUTINE W REFLEX MICROSCOPIC
Bilirubin Urine: NEGATIVE
Glucose, UA: NEGATIVE mg/dL
KETONES UR: 80 mg/dL — AB
Nitrite: NEGATIVE
PH: 6 (ref 5.0–8.0)
Protein, ur: NEGATIVE mg/dL
SPECIFIC GRAVITY, URINE: 1.016 (ref 1.005–1.030)

## 2017-12-08 MED ORDER — DEXTROSE 5 % IN LACTATED RINGERS IV BOLUS
1000.0000 mL | Freq: Once | INTRAVENOUS | Status: AC
  Administered 2017-12-08: 1000 mL via INTRAVENOUS

## 2017-12-08 MED ORDER — M.V.I. ADULT IV INJ
INJECTION | Freq: Once | INTRAVENOUS | Status: AC
  Administered 2017-12-08: 17:00:00 via INTRAVENOUS
  Filled 2017-12-08: qty 10

## 2017-12-08 MED ORDER — DIPHENHYDRAMINE HCL 50 MG/ML IJ SOLN
25.0000 mg | Freq: Once | INTRAMUSCULAR | Status: AC
  Administered 2017-12-08: 25 mg via INTRAVENOUS
  Filled 2017-12-08: qty 1

## 2017-12-08 MED ORDER — DEXAMETHASONE SODIUM PHOSPHATE 10 MG/ML IJ SOLN
10.0000 mg | Freq: Once | INTRAMUSCULAR | Status: AC
  Administered 2017-12-08: 10 mg via INTRAVENOUS
  Filled 2017-12-08: qty 1

## 2017-12-08 MED ORDER — PROMETHAZINE HCL 12.5 MG PO TABS: 12.5000 mg | ORAL_TABLET | Freq: Four times a day (QID) | ORAL | 0 refills | Status: DC | PRN

## 2017-12-08 MED ORDER — ONDANSETRON HCL 4 MG/2ML IJ SOLN
4.0000 mg | Freq: Once | INTRAMUSCULAR | Status: AC
  Administered 2017-12-08: 4 mg via INTRAVENOUS
  Filled 2017-12-08: qty 2

## 2017-12-08 NOTE — Telephone Encounter (Signed)
Patient called stating that she has been vomiting for last two days. Patient states that she would like to be seen because she is dehydrated. Patient advised to go to women's for evaluation. Armandina Stammer RN

## 2017-12-08 NOTE — MAU Provider Note (Signed)
Chief Complaint:  Emesis   First Provider Initiated Contact with Patient 12/08/17 1602      HPI: Katherine Shaw is a 27 y.o. G3P1012 at 60w0dwho presents to maternity admissions reporting N/V, diarrhea and abdominal pain.   Emesis   This is a new problem. Episode onset: 3 days ago. The problem occurs 2 to 4 times per day. The emesis has an appearance of stomach contents. There has been no fever. Associated symptoms include abdominal pain, diarrhea and headaches. Pertinent negatives include no chills, fever or URI. She has tried nothing (Patient reports she has not been able to get prescription for zofran due to insurance issue) for the symptoms.   She reports good fetal movement, denies LOF, vaginal bleeding, vaginal itching/burning, urinary symptoms or fever/chills.    Past Medical History: Past Medical History:  Diagnosis Date  . Anxiety   . Depression   . Thyroid disease     Past obstetric history: OB History  Gravida Para Term Preterm AB Living  0 1 2  SAB TAB Ectopic Multiple Live Births  1 0 0 1 2    # Outcome Date GA Lbr Len/2nd Weight Sex Delivery Anes PTL Lv  3 Current           2A Term 07/24/11     Vag-Spont   LIV  2B Term         LIV  1 SAB             Past Surgical History: Past Surgical History:  Procedure Laterality Date  . MOUTH SURGERY      Family History: Family History  Problem Relation Age of Onset  . Diabetes Mother   . Hypertension Mother   . Stroke Maternal Aunt   . Cancer Maternal Grandmother        ovarian  . Cancer Maternal Grandfather   . Cancer Paternal Grandfather     Social History: Social History   Tobacco Use  . Smoking status: Former Smoker    Packs/day: 0.50    Types: Cigarettes  . Smokeless tobacco: Never Used  Substance Use Topics  . Alcohol use: Not Currently  . Drug use: No    Allergies: No Known Allergies  Meds:  Medications Prior to Admission  Medication Sig Dispense Refill Last Dose  . acetaminophen  (TYLENOL) 500 MG tablet Take 1,000 mg by mouth every 6 (six) hours as needed for headache.   11/24/2017  . Prenatal Vit-Fe Fumarate-FA (PRENATAL MULTIVITAMIN) TABS tablet Take 1 tablet by mouth daily at 12 noon.   Past Month at Unknown time  . ondansetron (ZOFRAN ODT) 4 MG disintegrating tablet Take 1 tablet (4 mg total) by mouth every 6 (six) hours as needed for nausea. (Patient not taking: Reported on 10/20/2017) 20 tablet 0 Not Taking  . terconazole (TERAZOL 7) 0.4 % vaginal cream Place 1 applicator vaginally at bedtime. 45 g 0 11/24/2017    ROS:  Review of Systems  Constitutional: Negative for chills and fever.  Respiratory: Negative.   Cardiovascular: Negative.   Gastrointestinal: Positive for abdominal pain, diarrhea, nausea and vomiting. Negative for constipation.  Genitourinary: Negative.   Neurological: Positive for headaches.   I have reviewed patient's Past Medical Hx, Surgical Hx, Family Hx, Social Hx, medications and allergies.   Physical Exam   Patient Vitals for the past 24 hrs:  BP Temp Pulse Resp SpO2  12/08/17 1836 (!) 104/57 98.3 F (36.8 C) 87 16 -  12/08/17 1509 113/67 98.5  F (36.9 C) 87 16 99 %   Constitutional: Well-developed, well-nourished female in no acute distress.  Cardiovascular: normal rate Respiratory: normal effort GI: Abd soft, non-tender, gravid appropriate for gestational age.  MS: Extremities nontender, no edema, normal ROM Neurologic: Alert and oriented x 4.  GU: Neg CVAT.  Cervical Exam:  Dilation: Closed Effacement (%): Thick Cervical Position: Posterior Exam by:: Steward Drone, CNM  FHT:  Baseline 145 , moderate variability, accelerations present (10x10), no decelerations Contractions: none   Labs: Results for orders placed or performed during the hospital encounter of 12/08/17 (from the past 24 hour(s))  Urinalysis, Routine w reflex microscopic     Status: Abnormal   Collection Time: 12/08/17  2:58 PM  Result Value Ref Range    Color, Urine YELLOW YELLOW   APPearance HAZY (A) CLEAR   Specific Gravity, Urine 1.016 1.005 - 1.030   pH 6.0 5.0 - 8.0   Glucose, UA NEGATIVE NEGATIVE mg/dL   Hgb urine dipstick SMALL (A) NEGATIVE   Bilirubin Urine NEGATIVE NEGATIVE   Ketones, ur 80 (A) NEGATIVE mg/dL   Protein, ur NEGATIVE NEGATIVE mg/dL   Nitrite NEGATIVE NEGATIVE   Leukocytes, UA TRACE (A) NEGATIVE   RBC / HPF 0-5 0 - 5 RBC/hpf   WBC, UA 0-5 0 - 5 WBC/hpf   Bacteria, UA RARE (A) NONE SEEN   Squamous Epithelial / LPF 6-10 0 - 5   Mucus PRESENT    O/Positive/-- (01/16 1541)  MAU Course/MDM: Orders Placed This Encounter  Procedures  . Urinalysis, Routine w reflex microscopic  . Insert peripheral IV   UA- 80 ketones present on urine with hazy appearance   Meds ordered this encounter  Medications  . dextrose 5% lactated ringers bolus 1,000 mL  . multivitamins adult (MVI -12) 10 mL in dextrose 5% lactated ringers 1,000 mL infusion  . ondansetron (ZOFRAN) injection 4 mg  . dexamethasone (DECADRON) injection 10 mg  . diphenhydrAMINE (BENADRYL) injection 25 mg  . promethazine (PHENERGAN) 12.5 MG tablet    Sig: Take 1 tablet (12.5 mg total) by mouth every 6 (six) hours as needed for nausea or vomiting.    Dispense:  30 tablet    Refill:  0    Order Specific Question:   Supervising Provider    Answer:   Nettie Elm L [[id number]]   NST reviewed- reactive for gestational age  Treatments in MAU included 1,057mL D5LR, zofran  IV, multivitamin bag 1,050mL, headache cocktail with benadryl and decadron. Patient reports feeling better after MAU treatment, reports headache is completely gone and abdominal pain is relieved. Patient able to eat and keep liquid down prior to discharge.   Discussed with patient insurance issue- pt reports insurance has not been paying for medication because insurance card is in different name because patient just got married. Educated on the use of GoodRx for coupons when insurance will not  cover medicine and offered phenergan for N/V until insurance will cover already prescribed zofran. Educated that phenergan was $8 at [employer] [employer].  Rx for Zofran printed for patient.   Pt discharge. Pt stable prior to discharge.   Today's evaluation included a work-up for preterm labor which can be life-threatening for both mom and baby.  Assessment: 1. Nausea and vomiting during pregnancy   2. Diarrhea during pregnancy   3. Gastroenteritis due to norovirus     Plan: Discharge home Preterm Labor precautions and fetal kick counts Follow up as scheduled for prenatal appointments  Return to MAU  as needed for emergencies  Rx for Phenergan   Allergies as of 12/08/2017   No Known Allergies     Medication List    TAKE these medications   acetaminophen 500 MG tablet Commonly known as:  TYLENOL Take 1,000 mg by mouth every 6 (six) hours as needed for headache.   ondansetron 4 MG disintegrating tablet Commonly known as:  ZOFRAN ODT Take 1 tablet (4 mg total) by mouth every 6 (six) hours as needed for nausea.   prenatal multivitamin Tabs tablet Take 1 tablet by mouth daily at 12 noon.   promethazine 12.5 MG tablet Commonly known as:  PHENERGAN Take 1 tablet (12.5 mg total) by mouth every 6 (six) hours as needed for nausea or vomiting.   terconazole 0.4 % vaginal cream Commonly known as:  TERAZOL 7 Place 1 applicator vaginally at bedtime.       Steward Drone Certified Nurse-Midwife 12/08/2017 7:44 PM

## 2017-12-08 NOTE — MAU Note (Signed)
Hasn't been able to keep food or fluids down for the past 2 days.  Had been hit or miss before.  Called office, they told her to come here. Having diarrhea, started 2-3 days ago, loose; gone 2-3 times today.

## 2017-12-13 ENCOUNTER — Ambulatory Visit (INDEPENDENT_AMBULATORY_CARE_PROVIDER_SITE_OTHER): Payer: Medicaid Other | Admitting: Obstetrics & Gynecology

## 2017-12-13 ENCOUNTER — Encounter: Payer: Self-pay | Admitting: Obstetrics & Gynecology

## 2017-12-13 VITALS — BP 92/56 | HR 86 | Wt 250.0 lb

## 2017-12-13 DIAGNOSIS — O99332 Smoking (tobacco) complicating pregnancy, second trimester: Secondary | ICD-10-CM

## 2017-12-13 DIAGNOSIS — D573 Sickle-cell trait: Secondary | ICD-10-CM

## 2017-12-13 DIAGNOSIS — O99282 Endocrine, nutritional and metabolic diseases complicating pregnancy, second trimester: Secondary | ICD-10-CM

## 2017-12-13 DIAGNOSIS — E039 Hypothyroidism, unspecified: Secondary | ICD-10-CM

## 2017-12-13 DIAGNOSIS — Z3480 Encounter for supervision of other normal pregnancy, unspecified trimester: Secondary | ICD-10-CM

## 2017-12-13 DIAGNOSIS — O99330 Smoking (tobacco) complicating pregnancy, unspecified trimester: Secondary | ICD-10-CM

## 2017-12-13 DIAGNOSIS — O99012 Anemia complicating pregnancy, second trimester: Secondary | ICD-10-CM

## 2017-12-13 MED ORDER — LEVOTHYROXINE SODIUM 50 MCG PO TABS: 50.0000 ug | ORAL_TABLET | Freq: Every day | ORAL | 2 refills | Status: DC

## 2017-12-13 NOTE — Progress Notes (Signed)
   PRENATAL VISIT NOTE  Subjective:  Katherine Shaw is a 27 y.o. G3P1012 at [redacted]w[redacted]d being seen today for ongoing prenatal care.  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  Patient reports no complaints.  Contractions: Not present. Vag. Bleeding: None.  Movement: Present. Denies leaking of fluid.   The following portions of the patient's history were reviewed and updated as appropriate: allergies, current medications, past family history, past medical history, past social history, past surgical history and problem list. Problem list updated.  Objective:   Vitals:   12/13/17 1122  BP: (!) 92/56  Pulse: 86  Weight: 250 lb (113.4 kg)    Fetal Status: Fetal Heart Rate (bpm): 145   Movement: Present     General:  Alert, oriented and cooperative. Patient is in no acute distress.  Skin: Skin is warm and dry. No rash noted.   Cardiovascular: Normal heart rate noted  Respiratory: Normal respiratory effort, no problems with respiration noted  Abdomen: Soft, gravid, appropriate for gestational age.  Pain/Pressure: Present     Pelvic: Cervical exam deferred        Extremities: Normal range of motion.  Edema: None  Mental Status: Normal mood and affect. Normal behavior. Normal judgment and thought content.   Assessment and Plan:  Pregnancy: G3P1012 at [redacted]w[redacted]d  1. Supervision of other normal pregnancy, antepartum  2. Sickle cell trait (HCC)  3. Maternal tobacco use, antepartum  4. Hypothyroidism affecting pregnancy in second trimester Will begin Synthroid. Pt and her family are worried about pt begin off her meds. Pt was prev on meds and  was stopped by primary care prior to pregnancy.   Preterm labor symptoms and general obstetric precautions including but not limited to vaginal bleeding, contractions, leaking of fluid and fetal movement were reviewed in detail with the patient. Please refer to After Visit Summary for other counseling recommendations.  Return in about 2 weeks (around 12/27/2017).  Future Appointments  Date Time Provider Department Center  12/29/2017  9:30 AM [facility] [place] 1 [facility]    Willodean Rosenthal, MD

## 2017-12-27 ENCOUNTER — Ambulatory Visit (INDEPENDENT_AMBULATORY_CARE_PROVIDER_SITE_OTHER): Payer: Medicaid Other | Admitting: Obstetrics & Gynecology

## 2017-12-27 VITALS — BP 95/55 | HR 92 | Wt 255.4 lb

## 2017-12-27 DIAGNOSIS — D573 Sickle-cell trait: Secondary | ICD-10-CM

## 2017-12-27 DIAGNOSIS — O358XX0 Maternal care for other (suspected) fetal abnormality and damage, not applicable or unspecified: Secondary | ICD-10-CM

## 2017-12-27 DIAGNOSIS — E039 Hypothyroidism, unspecified: Secondary | ICD-10-CM

## 2017-12-27 DIAGNOSIS — O99282 Endocrine, nutritional and metabolic diseases complicating pregnancy, second trimester: Secondary | ICD-10-CM

## 2017-12-27 DIAGNOSIS — O99330 Smoking (tobacco) complicating pregnancy, unspecified trimester: Secondary | ICD-10-CM

## 2017-12-27 DIAGNOSIS — Z3480 Encounter for supervision of other normal pregnancy, unspecified trimester: Secondary | ICD-10-CM

## 2017-12-27 DIAGNOSIS — O35EXX0 Maternal care for other (suspected) fetal abnormality and damage, fetal genitourinary anomalies, not applicable or unspecified: Secondary | ICD-10-CM

## 2017-12-27 NOTE — Patient Instructions (Signed)

## 2017-12-27 NOTE — Progress Notes (Signed)
   PRENATAL VISIT NOTE  Subjective:  Katherine Shaw is a 27 y.o. G3P1012 at 892w5d being seen today for ongoing prenatal care.  She is currently monitored for the following issues for this high-risk pregnancy and has Supervision of normal pregnancy, antepartum; Maternal tobacco use, antepartum; Hypothyroidism affecting pregnancy in second trimester; Sickle cell trait (HCC); Ganglion cyst of dorsum of left wrist; and Ultrasound recheck of fetal pyelectasis, antepartum, not applicable or unspecified fetus on their problem list.  Patient reports no complaints.  Contractions: Not present. Vag. Bleeding: None.  Movement: Present. Denies leaking of fluid.   The following portions of the patient's history were reviewed and updated as appropriate: allergies, current medications, past family history, past medical history, past social history, past surgical history and problem list. Problem list updated.  Objective:   Vitals:   12/27/17 0812  BP: (!) 95/55  Pulse: 92  Weight: 255 lb 6.4 oz (115.8 kg)    Fetal Status: Fetal Heart Rate (bpm): 145   Movement: Present     General:  Alert, oriented and cooperative. Patient is in no acute distress.  Skin: Skin is warm and dry. No rash noted.   Cardiovascular: Normal heart rate noted  Respiratory: Normal respiratory effort, no problems with respiration noted  Abdomen: Soft, gravid, appropriate for gestational age.  Pain/Pressure: Absent     Pelvic: Cervical exam deferred        Extremities: Normal range of motion.  Edema: Trace  Mental Status: Normal mood and affect. Normal behavior. Normal judgment and thought content.   Assessment and Plan:  Pregnancy: G3P1012 at 5592w5d  1. Supervision of other normal pregnancy, antepartum  2. Ultrasound recheck of fetal pyelectasis, antepartum, not applicable or unspecified fetus Has f/u US on 6/5  3. Sickle cell trait (HCC) Pt spouse has not been tested   4. Maternal tobacco use, antepartum  5.  Hypothyroidism affecting pregnancy in second trimester  Preterm labor symptoms and general obstetric precautions including but not limited to vaginal bleeding, contractions, leaking of fluid and fetal movement were reviewed in detail with the patient. Please refer to After Visit Summary for other counseling recommendations.  Return in about 2 weeks (around 01/10/2018).  Future Appointments  Date Time Provider Department Center  12/29/2017  9:30 AM [facility] [place] 1 [facility]    Willodean Rosenthalarolyn Harraway-Smith, MD

## 2017-12-29 ENCOUNTER — Encounter (HOSPITAL_COMMUNITY): Payer: Self-pay

## 2017-12-29 ENCOUNTER — Ambulatory Visit (HOSPITAL_COMMUNITY)
Admission: RE | Admit: 2017-12-29 | Discharge: 2017-12-29 | Disposition: A | Discharge status: Home / Self Care | Payer: Medicaid Other | Source: Ambulatory Visit | Attending: Obstetrics & Gynecology | Admitting: Obstetrics & Gynecology

## 2017-12-29 ENCOUNTER — Ambulatory Visit: Payer: Medicaid Other

## 2017-12-29 ENCOUNTER — Other Ambulatory Visit (HOSPITAL_COMMUNITY): Payer: Self-pay | Admitting: Obstetrics and Gynecology

## 2017-12-29 DIAGNOSIS — O99213 Obesity complicating pregnancy, third trimester: Secondary | ICD-10-CM | POA: Diagnosis not present

## 2017-12-29 DIAGNOSIS — Z3A30 30 weeks gestation of pregnancy: Secondary | ICD-10-CM | POA: Diagnosis not present

## 2017-12-29 DIAGNOSIS — E039 Hypothyroidism, unspecified: Secondary | ICD-10-CM | POA: Insufficient documentation

## 2017-12-29 DIAGNOSIS — O99283 Endocrine, nutritional and metabolic diseases complicating pregnancy, third trimester: Principal | ICD-10-CM

## 2017-12-29 DIAGNOSIS — Z362 Encounter for other antenatal screening follow-up: Secondary | ICD-10-CM | POA: Insufficient documentation

## 2018-01-14 ENCOUNTER — Encounter: Payer: Medicaid Other | Admitting: Obstetrics & Gynecology

## 2018-01-20 ENCOUNTER — Ambulatory Visit (INDEPENDENT_AMBULATORY_CARE_PROVIDER_SITE_OTHER): Payer: Medicaid Other | Admitting: Family Medicine

## 2018-01-20 VITALS — BP 94/55 | HR 87 | Wt 253.8 lb

## 2018-01-20 DIAGNOSIS — Z3480 Encounter for supervision of other normal pregnancy, unspecified trimester: Secondary | ICD-10-CM

## 2018-01-20 DIAGNOSIS — D573 Sickle-cell trait: Secondary | ICD-10-CM

## 2018-01-20 DIAGNOSIS — O99282 Endocrine, nutritional and metabolic diseases complicating pregnancy, second trimester: Secondary | ICD-10-CM

## 2018-01-20 DIAGNOSIS — E039 Hypothyroidism, unspecified: Secondary | ICD-10-CM

## 2018-01-20 NOTE — Progress Notes (Signed)
   PRENATAL VISIT NOTE  Subjective:  Katherine ConchStephanie Shaw is a 27 y.o. G3P1012 at 9740w1d being seen today for ongoing prenatal care.  She is currently monitored for the following issues for this high-risk pregnancy and has Supervision of normal pregnancy, antepartum; Maternal tobacco use, antepartum; Hypothyroidism affecting pregnancy in second trimester; Sickle cell trait (HCC); Ganglion cyst of dorsum of left wrist; and Ultrasound recheck of fetal pyelectasis, antepartum, not applicable or unspecified fetus on their problem list.  Patient reports occasional contractions.  Contractions: Irritability. Vag. Bleeding: None.  Movement: Present. Denies leaking of fluid.   The following portions of the patient's history were reviewed and updated as appropriate: allergies, current medications, past family history, past medical history, past social history, past surgical history and problem list. Problem list updated.  Objective:   Vitals:   01/20/18 0817 01/20/18 0822  BP: (!) 95/46 (!) 94/55  Pulse: 89 87  Weight: 253 lb 12.8 oz (115.1 kg)     Fetal Status: Fetal Heart Rate (bpm): 140   Movement: Present     General:  Alert, oriented and cooperative. Patient is in no acute distress.  Skin: Skin is warm and dry. No rash noted.   Cardiovascular: Normal heart rate noted  Respiratory: Normal respiratory effort, no problems with respiration noted  Abdomen: Soft, gravid, appropriate for gestational age.  Pain/Pressure: Present     Pelvic: Cervical exam deferred        Extremities: Normal range of motion.  Edema: None  Mental Status: Normal mood and affect. Normal behavior. Normal judgment and thought content.   Assessment and Plan:  Pregnancy: G3P1012 at 3140w1d  1. Supervision of other normal pregnancy, antepartum FHT and FH normal. PP BTL paperwork signed today  2. Hypothyroidism affecting pregnancy in second trimester Recheck. Last time T3 was low. Synthroid started. Patient forgetting probably  more than half her doses. Talked about setting alarm on phone. Patient has morning sickness and has hard time taking it in the AM. Although not ideal, will have patient take in evening. If T3 remains high despite taking, may need to use Cytomel. - TSH - T3, free - T4, free  3. Sickle cell trait (HCC) FOB has not checked [place] status. Reminded him to get that done.  Preterm labor symptoms and general obstetric precautions including but not limited to vaginal bleeding, contractions, leaking of fluid and fetal movement were reviewed in detail with the patient. Please refer to After Visit Summary for other counseling recommendations.  Return in about 2 weeks (around 02/03/2018) for OB f/u.  No future appointments.  Levie HeritageJacob J Chadley Dziedzic, DO

## 2018-01-21 ENCOUNTER — Encounter: Payer: Self-pay | Admitting: Family Medicine

## 2018-01-21 LAB — T3, FREE: T3, Free: 2.5 pg/mL (ref 2.0–4.4)

## 2018-01-21 LAB — TSH: TSH: 5.29 u[IU]/mL — AB (ref 0.450–4.500)

## 2018-01-21 LAB — T4, FREE: Free T4: 0.98 ng/dL (ref 0.82–1.77)

## 2018-01-25 ENCOUNTER — Telehealth: Payer: Self-pay

## 2018-01-25 MED ORDER — LEVOTHYROXINE SODIUM 50 MCG PO TABS: 50.0000 ug | ORAL_TABLET | Freq: Every day | ORAL | 2 refills | Status: AC

## 2018-01-25 NOTE — Telephone Encounter (Signed)
Called pt to set up a time for her to come in the office to resign her BTL. Pt states that she will come to the office on 01/26/18.

## 2018-02-07 ENCOUNTER — Other Ambulatory Visit (HOSPITAL_COMMUNITY)
Admission: RE | Admit: 2018-02-07 | Discharge: 2018-02-07 | Disposition: A | Discharge status: Home / Self Care | Payer: Medicaid Other | Source: Ambulatory Visit | Attending: Obstetrics & Gynecology | Admitting: Obstetrics & Gynecology

## 2018-02-07 ENCOUNTER — Ambulatory Visit (INDEPENDENT_AMBULATORY_CARE_PROVIDER_SITE_OTHER): Payer: Medicaid Other | Admitting: Obstetrics & Gynecology

## 2018-02-07 VITALS — BP 94/65 | HR 94 | Wt 251.0 lb

## 2018-02-07 DIAGNOSIS — O99330 Smoking (tobacco) complicating pregnancy, unspecified trimester: Secondary | ICD-10-CM

## 2018-02-07 DIAGNOSIS — O99333 Smoking (tobacco) complicating pregnancy, third trimester: Secondary | ICD-10-CM

## 2018-02-07 DIAGNOSIS — O99282 Endocrine, nutritional and metabolic diseases complicating pregnancy, second trimester: Secondary | ICD-10-CM

## 2018-02-07 DIAGNOSIS — Z3480 Encounter for supervision of other normal pregnancy, unspecified trimester: Secondary | ICD-10-CM | POA: Diagnosis present

## 2018-02-07 DIAGNOSIS — O99283 Endocrine, nutritional and metabolic diseases complicating pregnancy, third trimester: Secondary | ICD-10-CM

## 2018-02-07 DIAGNOSIS — O358XX0 Maternal care for other (suspected) fetal abnormality and damage, not applicable or unspecified: Secondary | ICD-10-CM

## 2018-02-07 DIAGNOSIS — M67432 Ganglion, left wrist: Secondary | ICD-10-CM

## 2018-02-07 DIAGNOSIS — D573 Sickle-cell trait: Secondary | ICD-10-CM

## 2018-02-07 DIAGNOSIS — O35EXX0 Maternal care for other (suspected) fetal abnormality and damage, fetal genitourinary anomalies, not applicable or unspecified: Secondary | ICD-10-CM

## 2018-02-07 DIAGNOSIS — E039 Hypothyroidism, unspecified: Secondary | ICD-10-CM

## 2018-02-07 DIAGNOSIS — Z3A00 Weeks of gestation of pregnancy not specified: Secondary | ICD-10-CM | POA: Insufficient documentation

## 2018-02-07 DIAGNOSIS — O99013 Anemia complicating pregnancy, third trimester: Secondary | ICD-10-CM

## 2018-02-07 NOTE — Progress Notes (Signed)
   PRENATAL VISIT NOTE  Subjective:  Katherine Shaw is a 27 y.o. G3P1012 at 5734w5d being seen today for ongoing prenatal care.  She is currently monitored for the following issues for this high-risk pregnancy and has Supervision of normal pregnancy, antepartum; Maternal tobacco use, antepartum; Hypothyroidism affecting pregnancy in second trimester; Sickle cell trait (HCC); Ganglion cyst of dorsum of left wrist; and Ultrasound recheck of fetal pyelectasis, antepartum, not applicable or unspecified fetus on their problem list.  Patient reports no complaints.  Contractions: Irritability. Vag. Bleeding: None.  Movement: Present. Denies leaking of fluid.   The following portions of the patient's history were reviewed and updated as appropriate: allergies, current medications, past family history, past medical history, past social history, past surgical history and problem list. Problem list updated.  Objective:  There were no vitals filed for this visit.  Fetal Status: Fetal Heart Rate (bpm): 137   Movement: Present     General:  Alert, oriented and cooperative. Patient is in no acute distress.  Skin: Skin is warm and dry. No rash noted.   Cardiovascular: Normal heart rate noted  Respiratory: Normal respiratory effort, no problems with respiration noted  Abdomen: Soft, gravid, appropriate for gestational age.  Pain/Pressure: Present     Pelvic: Cervical exam performed        Extremities: Normal range of motion.  Edema: Trace  Mental Status: Normal mood and affect. Normal behavior. Normal judgment and thought content.   Assessment and Plan:  Pregnancy: G3P1012 at 7134w5d  1. Supervision of other normal pregnancy, antepartum GBS and cx done today  2. Ultrasound recheck of fetal pyelectasis, antepartum, not applicable or unspecified fetus F/u US is WNL  3. Sickle cell trait (HCC)  4. Maternal tobacco use, antepartum  5. Hypothyroidism affecting pregnancy in second trimester  6. Ganglion  cyst of dorsum of left wrist  Preterm labor symptoms and general obstetric precautions including but not limited to vaginal bleeding, contractions, leaking of fluid and fetal movement were reviewed in detail with the patient. Please refer to After Visit Summary for other counseling recommendations.  Return in about 1 week (around 02/14/2018).  No future appointments.  Willodean Rosenthalarolyn Harraway-Smith, MD

## 2018-02-07 NOTE — Patient Instructions (Signed)

## 2018-02-08 LAB — GC/CHLAMYDIA PROBE AMP (~~LOC~~) NOT AT ARMC
CHLAMYDIA, DNA PROBE: NEGATIVE
NEISSERIA GONORRHEA: NEGATIVE

## 2018-02-11 LAB — CULTURE, BETA STREP (GROUP B ONLY): STREP GP B CULTURE: POSITIVE — AB

## 2018-02-14 ENCOUNTER — Ambulatory Visit (INDEPENDENT_AMBULATORY_CARE_PROVIDER_SITE_OTHER): Payer: Medicaid Other | Admitting: Family Medicine

## 2018-02-14 VITALS — BP 108/69 | HR 98 | Wt 250.0 lb

## 2018-02-14 DIAGNOSIS — O358XX0 Maternal care for other (suspected) fetal abnormality and damage, not applicable or unspecified: Secondary | ICD-10-CM

## 2018-02-14 DIAGNOSIS — O35EXX0 Maternal care for other (suspected) fetal abnormality and damage, fetal genitourinary anomalies, not applicable or unspecified: Secondary | ICD-10-CM

## 2018-02-14 DIAGNOSIS — E039 Hypothyroidism, unspecified: Secondary | ICD-10-CM

## 2018-02-14 DIAGNOSIS — D573 Sickle-cell trait: Secondary | ICD-10-CM

## 2018-02-14 DIAGNOSIS — Z3480 Encounter for supervision of other normal pregnancy, unspecified trimester: Secondary | ICD-10-CM

## 2018-02-14 DIAGNOSIS — O99282 Endocrine, nutritional and metabolic diseases complicating pregnancy, second trimester: Secondary | ICD-10-CM

## 2018-02-14 NOTE — Progress Notes (Signed)
   PRENATAL VISIT NOTE  Subjective:  Katherine Shaw is a 27 y.o. G3P1012 at 5134w5d being seen today for ongoing prenatal care.  She is currently monitored for the following issues for this high-risk pregnancy and has Supervision of normal pregnancy, antepartum; Maternal tobacco use, antepartum; Hypothyroidism affecting pregnancy in second trimester; Sickle cell trait (HCC); Ganglion cyst of dorsum of left wrist; and Ultrasound recheck of fetal pyelectasis, antepartum, not applicable or unspecified fetus on their problem list.  Patient reports occasional contractions.  Contractions: Irregular. Vag. Bleeding: None.  Movement: Present. Denies leaking of fluid.   The following portions of the patient's history were reviewed and updated as appropriate: allergies, current medications, past family history, past medical history, past social history, past surgical history and problem list. Problem list updated.  Objective:   Vitals:   02/14/18 0804  BP: 108/69  Pulse: 98  Weight: 250 lb (113.4 kg)    Fetal Status: Fetal Heart Rate (bpm): 147 Fundal Height: 37 cm Movement: Present  Presentation: Vertex  General:  Alert, oriented and cooperative. Patient is in no acute distress.  Skin: Skin is warm and dry. No rash noted.   Cardiovascular: Normal heart rate noted  Respiratory: Normal respiratory effort, no problems with respiration noted  Abdomen: Soft, gravid, appropriate for gestational age.  Pain/Pressure: Absent     Pelvic: Cervical exam deferred Dilation: 1.5 Effacement (%): 40 Station: -3  Extremities: Normal range of motion.  Edema: None  Mental Status: Normal mood and affect. Normal behavior. Normal judgment and thought content.   Assessment and Plan:  Pregnancy: G3P1012 at 3534w5d  1. Supervision of other normal pregnancy, antepartum FHT and FH normal  2. Hypothyroidism affecting pregnancy in second trimester Patient hasn't been taking synthroid - hasn't remembered to pick it  up  3. Ultrasound recheck of fetal pyelectasis, antepartum, not applicable or unspecified fetus Last US nml  4. Sickle cell trait (HCC)   Preterm labor symptoms and general obstetric precautions including but not limited to vaginal bleeding, contractions, leaking of fluid and fetal movement were reviewed in detail with the patient. Please refer to After Visit Summary for other counseling recommendations.  No follow-ups on file.  No future appointments.  Levie HeritageJacob J Marai Teehan, DO

## 2018-02-15 ENCOUNTER — Encounter (HOSPITAL_COMMUNITY): Payer: Self-pay | Admitting: *Deleted

## 2018-02-15 ENCOUNTER — Inpatient Hospital Stay (HOSPITAL_COMMUNITY)
Admission: AD | Admit: 2018-02-15 | Discharge: 2018-02-15 | Disposition: A | Discharge status: Home / Self Care | Payer: Medicaid Other | Source: Ambulatory Visit | Attending: Family Medicine | Admitting: Family Medicine

## 2018-02-15 DIAGNOSIS — D573 Sickle-cell trait: Secondary | ICD-10-CM

## 2018-02-15 DIAGNOSIS — O358XX0 Maternal care for other (suspected) fetal abnormality and damage, not applicable or unspecified: Secondary | ICD-10-CM

## 2018-02-15 DIAGNOSIS — Z3A37 37 weeks gestation of pregnancy: Secondary | ICD-10-CM | POA: Insufficient documentation

## 2018-02-15 DIAGNOSIS — O99330 Smoking (tobacco) complicating pregnancy, unspecified trimester: Secondary | ICD-10-CM

## 2018-02-15 DIAGNOSIS — O99282 Endocrine, nutritional and metabolic diseases complicating pregnancy, second trimester: Secondary | ICD-10-CM

## 2018-02-15 DIAGNOSIS — O35EXX0 Maternal care for other (suspected) fetal abnormality and damage, fetal genitourinary anomalies, not applicable or unspecified: Secondary | ICD-10-CM

## 2018-02-15 DIAGNOSIS — O479 False labor, unspecified: Secondary | ICD-10-CM

## 2018-02-15 DIAGNOSIS — Z3480 Encounter for supervision of other normal pregnancy, unspecified trimester: Secondary | ICD-10-CM

## 2018-02-15 DIAGNOSIS — E039 Hypothyroidism, unspecified: Secondary | ICD-10-CM

## 2018-02-15 NOTE — MAU Note (Signed)
Urine in lab 

## 2018-02-15 NOTE — MAU Note (Signed)
PT SAYS UC STARTED AT 630PM-    [facility]- WITH [place] [facility]ICE- VE  2 CM ON Monday.  DENIES HSV AND MRSA.   GBS- POSITIVE.

## 2018-02-15 NOTE — MAU Note (Signed)
I have communicated with Dr. Corky Downslsen and reviewed vital signs:  Vitals:   02/15/18 2106  BP: (!) 102/59  Pulse: 96  Resp: 20  Temp: 98.1 F (36.7 C)    Vaginal exam:  Dilation: 1.5 Effacement (%): 40 Cervical Position: Posterior Station: Ballotable Presentation: Vertex Exam by:: Latricia HeftAnna Lakiyah Arntson, RN,   Also reviewed contraction pattern and that non-stress test is reactive.  It has been documented that patient is contracting every 8 minutes with no cervical change since yesterday, not indicating active labor.  Patient denies any other complaints.  Based on this report provider has given order for discharge.  A discharge order and diagnosis entered by a provider.   Labor discharge instructions reviewed with patient.

## 2018-02-17 ENCOUNTER — Encounter: Payer: Self-pay | Admitting: Family Medicine

## 2018-02-17 ENCOUNTER — Inpatient Hospital Stay (HOSPITAL_COMMUNITY)
Admission: AD | Admit: 2018-02-17 | Discharge: 2018-02-17 | Disposition: A | Discharge status: Home / Self Care | Payer: Medicaid Other | Source: Ambulatory Visit | Attending: Obstetrics & Gynecology | Admitting: Obstetrics & Gynecology

## 2018-02-17 ENCOUNTER — Encounter (HOSPITAL_COMMUNITY): Payer: Self-pay

## 2018-02-17 DIAGNOSIS — Z79890 Hormone replacement therapy (postmenopausal): Secondary | ICD-10-CM | POA: Insufficient documentation

## 2018-02-17 DIAGNOSIS — E079 Disorder of thyroid, unspecified: Secondary | ICD-10-CM | POA: Insufficient documentation

## 2018-02-17 DIAGNOSIS — O99283 Endocrine, nutritional and metabolic diseases complicating pregnancy, third trimester: Secondary | ICD-10-CM | POA: Diagnosis not present

## 2018-02-17 DIAGNOSIS — Z3A37 37 weeks gestation of pregnancy: Secondary | ICD-10-CM | POA: Diagnosis not present

## 2018-02-17 DIAGNOSIS — E039 Hypothyroidism, unspecified: Secondary | ICD-10-CM

## 2018-02-17 DIAGNOSIS — F419 Anxiety disorder, unspecified: Secondary | ICD-10-CM | POA: Diagnosis not present

## 2018-02-17 DIAGNOSIS — O99343 Other mental disorders complicating pregnancy, third trimester: Secondary | ICD-10-CM | POA: Insufficient documentation

## 2018-02-17 DIAGNOSIS — O99330 Smoking (tobacco) complicating pregnancy, unspecified trimester: Secondary | ICD-10-CM

## 2018-02-17 DIAGNOSIS — D573 Sickle-cell trait: Secondary | ICD-10-CM

## 2018-02-17 DIAGNOSIS — O99282 Endocrine, nutritional and metabolic diseases complicating pregnancy, second trimester: Secondary | ICD-10-CM

## 2018-02-17 DIAGNOSIS — Z87891 Personal history of nicotine dependence: Secondary | ICD-10-CM | POA: Insufficient documentation

## 2018-02-17 DIAGNOSIS — O35EXX0 Maternal care for other (suspected) fetal abnormality and damage, fetal genitourinary anomalies, not applicable or unspecified: Secondary | ICD-10-CM

## 2018-02-17 DIAGNOSIS — O471 False labor at or after 37 completed weeks of gestation: Secondary | ICD-10-CM | POA: Diagnosis not present

## 2018-02-17 DIAGNOSIS — O358XX0 Maternal care for other (suspected) fetal abnormality and damage, not applicable or unspecified: Secondary | ICD-10-CM

## 2018-02-17 DIAGNOSIS — O368130 Decreased fetal movements, third trimester, not applicable or unspecified: Secondary | ICD-10-CM | POA: Diagnosis present

## 2018-02-17 DIAGNOSIS — O479 False labor, unspecified: Secondary | ICD-10-CM

## 2018-02-17 DIAGNOSIS — F329 Major depressive disorder, single episode, unspecified: Secondary | ICD-10-CM | POA: Insufficient documentation

## 2018-02-17 NOTE — MAU Note (Signed)
Pt states contractions started several days ago. States contractions are every . Pt denies LOF or vaginal bleeding. States she has not felt movement 6pm. Cervix was 2cm

## 2018-02-17 NOTE — MAU Provider Note (Signed)
Chief Complaint:  Contractions and Decreased Fetal Movement   First Provider Initiated Contact with Patient 02/17/18 2324     HPI: Katherine ConchStephanie Shaw is a 27 y.o. G3P1012 at 8337w1dwho presents to maternity admissions reporting decreased fetal movement since 6pm.  Also having some contractions, not sure if it is labor. She denies LOF, vaginal bleeding, vaginal itching/burning, urinary symptoms, h/a, dizziness, n/v, diarrhea, constipation or fever/chills.  She denies headache, visual changes or RUQ abdominal pain.  Abdominal Pain  This is a recurrent problem. The current episode started today. The onset quality is gradual. The problem occurs intermittently. The pain is located in the LLQ, RLQ and suprapubic region. The quality of the pain is cramping. The abdominal pain does not radiate. Pertinent negatives include no constipation, diarrhea, dysuria or fever. Nothing aggravates the pain. The pain is relieved by nothing. She has tried nothing for the symptoms.    RN Note: Pt states contractions started several days ago. States contractions are every 15mins. Pt denies LOF or vaginal bleeding. States she has not felt movement 6pm. Cervix was 2cm   Past Medical History: Past Medical History:  Diagnosis Date  . Anxiety   . Depression   . Thyroid disease     Past obstetric history: OB History  Gravida Para Term Preterm AB Living  3 1 1  0 1 2  SAB TAB Ectopic Multiple Live Births  1 0 0 1 2    # Outcome Date GA Lbr Len/2nd Weight Sex Delivery Anes PTL Lv  3 Current           2A Term 07/24/11     Vag-Spont   LIV  2B Term         LIV  1 SAB             Past Surgical History: Past Surgical History:  Procedure Laterality Date  . MOUTH SURGERY      Family History: Family History  Problem Relation Age of Onset  . Diabetes Mother   . Hypertension Mother   . Stroke Maternal Aunt   . Cancer Maternal Grandmother        ovarian  . Cancer Maternal Grandfather   . Cancer Paternal  Grandfather     Social History: Social History   Tobacco Use  . Smoking status: Former Smoker    Packs/day: 0.50    Types: Cigarettes  . Smokeless tobacco: Never Used  Substance Use Topics  . Alcohol use: Not Currently  . Drug use: No    Allergies: No Known Allergies  Meds:  Medications Prior to Admission  Medication Sig Dispense Refill Last Dose  . acetaminophen (TYLENOL) 500 MG tablet Take 1,000 mg by mouth every 6 (six) hours as needed for headache.   Past Month at Unknown time  . levothyroxine (SYNTHROID) 50 MCG tablet Take 1 tablet (50 mcg total) by mouth daily before breakfast. 30 tablet 2 Past Week at Unknown time  . Prenatal Vit-Fe Fumarate-FA (PRENATAL MULTIVITAMIN) TABS tablet Take 1 tablet by mouth daily at 12 noon.   02/17/2018 at Unknown time    I have reviewed patient's Past Medical Hx, Surgical Hx, Family Hx, Social Hx, medications and allergies.   ROS:  Review of Systems  Constitutional: Negative for fever.  Gastrointestinal: Positive for abdominal pain. Negative for constipation and diarrhea.  Genitourinary: Negative for dysuria.   Other systems negative  Physical Exam   Patient Vitals for the past 24 hrs:  BP Temp Temp src Pulse Resp SpO2 Height  Weight  02/17/18 2249 123/67 98.3 F (36.8 C) Oral 96 20 100 % 5\' 5"  (1.651 m) 249 lb (112.9 kg)   Constitutional: Well-developed, well-nourished female in no acute distress.  Cardiovascular: normal rate and rhythm Respiratory: normal effort, clear to auscultation bilaterally GI: Abd soft, non-tender, gravid appropriate for gestational age.   No rebound or guarding.   MS: Extremities nontender, no edema, normal ROM Neurologic: Alert and oriented x 4.  GU: Neg CVAT.  PELVIC EXAM:   Dilation: 1.5 Effacement (%): 50 Station: [place], -3 Exam by:: Wynelle Bourgeois CNM   FHT:  Baseline 135 , moderate variability, accelerations present, no decelerations Contractions: q 5-6 mins Irregular  Audible fetal  movement.  Pt feels it now   Labs: O/Positive/-- (01/16 1541) No results found for this or any previous visit (from the past 24 hour(s)).  Imaging:  No results found.  MAU Course/MDM: NST reviewed and is reassuring, category I  Audible fetal movement Reviewed labor signs.  Discussed contractions will get closer and more intense Treatments in MAU included EFM.    Assessment: Single intrauterine pregnancy at [redacted]w[redacted]d Decreased fetal movement Prodromal uterine contractions  Plan: Discharge home Labor precautions and fetal kick counts Encouraged to start taking synthroid.  Discussed options for remembering Follow up in Office for prenatal visits and recheck of status  Encouraged to return here or to other Urgent Care/ED if she develops worsening of symptoms, increase in pain, fever, or other concerning symptoms.   Pt stable at time of discharge.  Wynelle Bourgeois CNM, MSN Certified Nurse-Midwife 02/17/2018 11:24 PM

## 2018-02-17 NOTE — Discharge Instructions (Signed)
Vaginal Delivery  Vaginal delivery means that you will give birth by pushing your baby out of your birth canal (vagina). A team of health care providers will help you before, during, and after vaginal delivery. Birth experiences are unique for every woman and every pregnancy, and birth experiences vary depending on where you choose to give birth.  What should I do to prepare for my baby's birth?  Before your baby is born, it is important to talk with your health care provider about:   Your labor and delivery preferences. These may include:  ? Medicines that you may be given.  ? How you will manage your pain. This might include non-medical pain relief techniques or injectable pain relief such as epidural analgesia.  ? How you and your baby will be monitored during labor and delivery.  ? Who may be in the labor and delivery room with you.  ? Your feelings about surgical delivery of your baby (cesarean delivery, or C-section) if this becomes necessary.  ? Your feelings about receiving donated blood through an IV tube (blood transfusion) if this becomes necessary.   Whether you are able:  ? To take pictures or videos of the birth.  ? To eat during labor and delivery.  ? To move around, walk, or change positions during labor and delivery.   What to expect after your baby is born, such as:  ? Whether delayed umbilical cord clamping and cutting is offered.  ? Who will care for your baby right after birth.  ? Medicines or tests that may be recommended for your baby.  ? Whether breastfeeding is supported in your hospital or birth center.  ? How long you will be in the hospital or birth center.   How any medical conditions you have may affect your baby or your labor and delivery experience.    To prepare for your baby's birth, you should also:   Attend all of your health care visits before delivery (prenatal visits) as recommended by your health care provider. This is important.   Prepare your home for your baby's  arrival. Make sure that you have:  ? Diapers.  ? Baby clothing.  ? Feeding equipment.  ? Safe sleeping arrangements for you and your baby.   Install a car seat in your vehicle. Have your car seat checked by a certified car seat installer to make sure that it is installed safely.   Think about who will help you with your new baby at home for at least the first several weeks after delivery.    What can I expect when I arrive at the birth center or hospital?  Once you are in labor and have been admitted into the hospital or birth center, your health care provider may:   Review your pregnancy history and any concerns you have.   Insert an IV tube into one of your veins. This is used to give you fluids and medicines.   Check your blood pressure, pulse, temperature, and heart rate (vital signs).   Check whether your bag of water (amniotic sac) has broken (ruptured).   Talk with you about your birth plan and discuss pain control options.    Monitoring  Your health care provider may monitor your contractions (uterine monitoring) and your baby's heart rate (fetal monitoring). You may need to be monitored:   Often, but not continuously (intermittently).   All the time or for long periods at a time (continuously). Continuous monitoring may be needed if:  ?   You are taking certain medicines, such as medicine to relieve pain or make your contractions stronger.  ? You have pregnancy or labor complications.    Monitoring may be done by:   Placing a special stethoscope or a handheld monitoring device on your abdomen to check your baby's heartbeat, and feeling your abdomen for contractions. This method of monitoring does not continuously record your baby's heartbeat or your contractions.   Placing monitors on your abdomen (external monitors) to record your baby's heartbeat and the frequency and length of contractions. You may not have to wear external monitors all the time.   Placing monitors inside of your uterus  (internal monitors) to record your baby's heartbeat and the frequency, length, and strength of your contractions.  ? Your health care provider may use internal monitors if he or she needs more information about the strength of your contractions or your baby's heart rate.  ? Internal monitors are put in place by passing a thin, flexible wire through your vagina and into your uterus. Depending on the type of monitor, it may remain in your uterus or on your baby's head until birth.  ? Your health care provider will discuss the benefits and risks of internal monitoring with you and will ask for your permission before inserting the monitors.   Telemetry. This is a type of continuous monitoring that can be done with external or internal monitors. Instead of having to stay in bed, you are able to move around during telemetry. Ask your health care provider if telemetry is an option for you.    Physical exam  Your health care provider may perform a physical exam. This may include:   Checking whether your baby is positioned:  ? With the head toward your vagina (head-down). This is most common.  ? With the head toward the top of your uterus (head-up or breech). If your baby is in a breech position, your health care provider may try to turn your baby to a head-down position so you can deliver vaginally. If it does not seem that your baby can be born vaginally, your provider may recommend surgery to deliver your baby. In rare cases, you may be able to deliver vaginally if your baby is head-up (breech delivery).  ? Lying sideways (transverse). Babies that are lying sideways cannot be delivered vaginally.   Checking your cervix to determine:  ? Whether it is thinning out (effacing).  ? Whether it is opening up (dilating).  ? How low your baby has moved into your birth canal.    What are the three stages of labor and delivery?    Normal labor and delivery is divided into the following three stages:  Stage 1   Stage 1 is the  longest stage of labor, and it can last for hours or days. Stage 1 includes:  ? Early labor. This is when contractions may be irregular, or regular and mild. Generally, early labor contractions are more than 10 minutes apart.  ? Active labor. This is when contractions get longer, more regular, more frequent, and more intense.  ? The transition phase. This is when contractions happen very close together, are very intense, and may last longer than during any other part of labor.   Contractions generally feel mild, infrequent, and irregular at first. They get stronger, more frequent (about every 2-3 minutes), and more regular as you progress from early labor through active labor and transition.   Many women progress through stage 1 naturally, but   you may need help to continue making progress. If this happens, your health care provider may talk with you about:  ? Rupturing your amniotic sac if it has not ruptured yet.  ? Giving you medicine to help make your contractions stronger and more frequent.   Stage 1 ends when your cervix is completely dilated to 4 inches (10 cm) and completely effaced. This happens at the end of the transition phase.  Stage 2   Once your cervix is completely effaced and dilated to 4 inches (10 cm), you may start to feel an urge to push. It is common for the body to naturally take a rest before feeling the urge to push, especially if you received an epidural or certain other pain medicines. This rest period may last for up to 1-2 hours, depending on your unique labor experience.   During stage 2, contractions are generally less painful, because pushing helps relieve contraction pain. Instead of contraction pain, you may feel stretching and burning pain, especially when the widest part of your baby's head passes through the vaginal opening (crowning).   Your health care provider will closely monitor your pushing progress and your baby's progress through the vagina during stage 2.   Your  health care provider may massage the area of skin between your vaginal opening and anus (perineum) or apply warm compresses to your perineum. This helps it stretch as the baby's head starts to crown, which can help prevent perineal tearing.  ? In some cases, an incision may be made in your perineum (episiotomy) to allow the baby to pass through the vaginal opening. An episiotomy helps to make the opening of the vagina larger to allow more room for the baby to fit through.   It is very important to breathe and focus so your health care provider can control the delivery of your baby's head. Your health care provider may have you decrease the intensity of your pushing, to help prevent perineal tearing.   After delivery of your baby's head, the shoulders and the rest of the body generally deliver very quickly and without difficulty.   Once your baby is delivered, the umbilical cord may be cut right away, or this may be delayed for 1-2 minutes, depending on your baby's health. This may vary among health care providers, hospitals, and birth centers.   If you and your baby are healthy enough, your baby may be placed on your chest or abdomen to help maintain the baby's temperature and to help you bond with each other. Some mothers and babies start breastfeeding at this time. Your health care team will dry your baby and help keep your baby warm during this time.   Your baby may need immediate care if he or she:  ? Showed signs of distress during labor.  ? Has a medical condition.  ? Was born too early (prematurely).  ? Had a bowel movement before birth (meconium).  ? Shows signs of difficulty transitioning from being inside the uterus to being outside of the uterus.  If you are planning to breastfeed, your health care team will help you begin a feeding.  Stage 3   The third stage of labor starts immediately after the birth of your baby and ends after you deliver the placenta. The placenta is an organ that develops  during pregnancy to provide oxygen and nutrients to your baby in the womb.   Delivering the placenta may require some pushing, and you may have mild contractions. Breastfeeding   you to breastfeed your baby. After labor is over, you and your baby will be monitored closely to ensure that you are both healthy until you are ready to go home. Your health care team will teach you how to care for yourself and your baby. This information is not intended to replace advice given to you by your health care provider. Make sure you discuss any questions you have with your health care provider. Document Released: 04/21/2008 Document Revised: 01/31/2016 Document Reviewed: 07/28/2015 Elsevier Interactive Patient Education  2018 [employer] Third Trimester of Pregnancy The third trimester is from week 29 through week 42, months 7 through 9. This trimester is when your unborn baby (fetus) is growing very fast. At the end of the ninth month, the unborn baby is about 20 inches in length. It weighs about 6-10 pounds. Follow these instructions at home:  Avoid all smoking, herbs, and alcohol. Avoid drugs not approved by your doctor.  Do not use any tobacco products, including cigarettes, chewing tobacco, and electronic cigarettes. If you need help quitting, ask your doctor. You may get counseling or other support to help you quit.  Only take  medicine as told by your doctor. Some medicines are safe and some are not during pregnancy.  Exercise only as told by your doctor. Stop exercising if you start having cramps.  Eat regular, healthy meals.  Wear a good support bra if your breasts are tender.  Do not use hot tubs, steam rooms, or saunas.  Wear your seat belt when driving.  Avoid raw meat, uncooked cheese, and liter boxes and soil used by cats.  Take your prenatal vitamins.  Take 1500-2000 milligrams of calcium daily starting at the 20th week of pregnancy until you deliver your baby.  Try taking medicine that helps you poop (stool softener) as needed, and if your doctor approves. Eat more fiber by eating fresh fruit, vegetables, and whole grains. Drink enough fluids to keep your pee (urine) clear or pale yellow.  Take warm water baths (sitz baths) to soothe pain or discomfort caused by hemorrhoids. Use hemorrhoid cream if your doctor approves.  If you have puffy, bulging veins (varicose veins), wear support hose. Raise (elevate) your feet for 15 minutes, 3-4 times a day. Limit salt in your diet.  Avoid heavy lifting, wear low heels, and sit up straight.  Rest with your legs raised if you have leg cramps or low back pain.  Visit your dentist if you have not gone during your pregnancy. Use a soft toothbrush to brush your teeth. Be gentle when you floss.  You can have sex (intercourse) unless your doctor tells you not to.  Do not travel far distances unless you must. Only do so with your doctor's approval.  Take prenatal classes.  Practice driving to the hospital.  Pack your hospital bag.  Prepare the baby's room.  Go to your doctor visits. Get help if:  You are not sure if you are in labor or if your water has broken.  You are dizzy.  You have mild cramps or pressure in your lower belly (abdominal).  You have a nagging pain in your belly area.  You continue to feel sick to your stomach (nauseous), throw  up (vomit), or have watery poop (diarrhea).  You have bad smelling fluid coming from your vagina.  You have pain with peeing (urination). Get help right away if:  You have a fever.  You are leaking fluid from your vagina.  You are spotting   or bleeding from your vagina.  You have severe belly cramping or pain.  You lose or gain weight rapidly.  You have trouble catching your breath and have chest pain.  You notice sudden or extreme puffiness (swelling) of your face, hands, ankles, feet, or legs.  You have not felt the baby move in over an hour.  You have severe headaches that do not go away with medicine.  You have vision changes. This information is not intended to replace advice given to you by your health care provider. Make sure you discuss any questions you have with your health care provider. Document Released: 10/07/2009 Document Revised: 12/19/2015 Document Reviewed: 09/13/2012 Elsevier Interactive Patient Education  2017 [employer]  

## 2018-02-22 ENCOUNTER — Ambulatory Visit (INDEPENDENT_AMBULATORY_CARE_PROVIDER_SITE_OTHER): Payer: Medicaid Other | Admitting: Advanced Practice Midwife

## 2018-02-22 ENCOUNTER — Encounter: Payer: Self-pay | Admitting: Advanced Practice Midwife

## 2018-02-22 VITALS — BP 98/58 | HR 84 | Wt 251.0 lb

## 2018-02-22 DIAGNOSIS — Z3480 Encounter for supervision of other normal pregnancy, unspecified trimester: Secondary | ICD-10-CM

## 2018-02-22 DIAGNOSIS — E039 Hypothyroidism, unspecified: Secondary | ICD-10-CM

## 2018-02-22 DIAGNOSIS — O99282 Endocrine, nutritional and metabolic diseases complicating pregnancy, second trimester: Secondary | ICD-10-CM

## 2018-02-22 DIAGNOSIS — Z3483 Encounter for supervision of other normal pregnancy, third trimester: Secondary | ICD-10-CM

## 2018-02-22 DIAGNOSIS — O99283 Endocrine, nutritional and metabolic diseases complicating pregnancy, third trimester: Secondary | ICD-10-CM

## 2018-02-22 NOTE — Progress Notes (Deleted)
   PRENATAL VISIT NOTE  Subjective:  Katherine Shaw is a 27 y.o. G3P1012 at 2829w6d being seen today for ongoing prenatal care.  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  Patient reports {sx:14538}.  Contractions: Irregular. Vag. Bleeding: None.  Movement: Present. Denies leaking of fluid.   The following portions of the patient's history were reviewed and updated as appropriate: allergies, current medications, past family history, past medical history, past social history, past surgical history and problem list. Problem list updated.  Objective:   Vitals:   02/22/18 0804  BP: (!) 98/58  Pulse: 84  Weight: 251 lb 0.6 oz (113.9 kg)    Fetal Status: Fetal Heart Rate (bpm): 139   Movement: Present     General:  Alert, oriented and cooperative. Patient is in no acute distress.  Skin: Skin is warm and dry. No rash noted.   Cardiovascular: Normal heart rate noted  Respiratory: Normal respiratory effort, no problems with respiration noted  Abdomen: Soft, gravid, appropriate for gestational age.  Pain/Pressure: Present     Pelvic: {Blank single:19197::"Cervical exam performed","Cervical exam deferred"}        Extremities: Normal range of motion.  Edema: None  Mental Status: Normal mood and affect. Normal behavior. Normal judgment and thought content.   Assessment and Plan:  Pregnancy: G3P1012 at 4429w6d  1. Supervision of other normal pregnancy, antepartum ***  2. Hypothyroidism affecting pregnancy in second trimester ***  {Blank single:19197::"Term","Preterm"} labor symptoms and general obstetric  precautions including but not limited to vaginal bleeding, contractions, leaking of fluid and fetal movement were reviewed in detail with the patient. Please refer to After Visit Summary for other counseling recommendations.  No follow-ups on file.  Future Appointments  Date Time Provider Department Center  02/28/2018  9:00 AM Anyanwu, Jethro BastosUgonna A, MD [facility] None    Wynelle BourgeoisMarie Ibrahima Holberg, CNM

## 2018-02-22 NOTE — Progress Notes (Signed)
   PRENATAL VISIT NOTE  Subjective:  Katherine Shaw is a 27 y.o. G3P1012 at 8275w6d being seen today for ongoing prenatal care.  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  Patient reports occasional contractions.  Contractions: Irregular. Vag. Bleeding: None.  Movement: Present. Denies leaking of fluid.   The following portions of the patient's history were reviewed and updated as appropriate: allergies, current medications, past family history, past medical history, past social history, past surgical history and problem list. Problem list updated.  Objective:   Vitals:   02/22/18 0804  BP: (!) 98/58  Pulse: 84  Weight: 251 lb 0.6 oz (113.9 kg)    Fetal Status: Fetal Heart Rate (bpm): 139   Movement: Present     General:  Alert, oriented and cooperative. Patient is in no acute distress.  Skin: Skin is warm and dry. No rash noted.   Cardiovascular: Normal heart rate noted  Respiratory: Normal respiratory effort, no problems with respiration noted  Abdomen: Soft, gravid, appropriate for gestational age.  Pain/Pressure: Present     Pelvic: Cervical exam performed      1-2/60/-2/vtx  Membranes swept per patient request   Extremities: Normal range of motion.  Edema: None  Mental Status: Normal mood and affect. Normal behavior. Normal judgment and thought content.   Assessment and Plan:  Pregnancy: G3P1012 at 5475w6d  1. Supervision of other normal pregnancy, antepartum      Labor precautions     Membranes swept per request        2. Hypothyroidism affecting pregnancy in second trimester     Restarted her Synthroid  3.  GBS  Treat in labor  Term labor symptoms and general obstetric precautions including but not limited to vaginal bleeding, contractions, leaking of fluid and fetal movement were reviewed in detail with the patient. Please refer to After Visit Summary for other counseling recommendations.    Future Appointments  Date Time Provider Department Center  02/28/2018  9:00 AM Anyanwu, Jethro BastosUgonna A, MD [facility] None    Wynelle BourgeoisMarie Kaedin Hicklin, CNM

## 2018-02-22 NOTE — Patient Instructions (Signed)
Vaginal Delivery Vaginal delivery means that you will give birth by pushing your baby out of your birth canal (vagina). A team of health care providers will help you before, during, and after vaginal delivery. Birth experiences are unique for every woman and every pregnancy, and birth experiences vary depending on where you choose to give birth. What should I do to prepare for my baby's birth? Before your baby is born, it is important to talk with your health care provider about:  Your labor and delivery preferences. These may include: ? Medicines that you may be given. ? How you will manage your pain. This might include non-medical pain relief techniques or injectable pain relief such as epidural analgesia. ? How you and your baby will be monitored during labor and delivery. ? Who may be in the labor and delivery room with you. ? Your feelings about surgical delivery of your baby (cesarean delivery, or C-section) if this becomes necessary. ? Your feelings about receiving donated blood through an IV tube (blood transfusion) if this becomes necessary.  Whether you are able: ? To take pictures or videos of the birth. ? To eat during labor and delivery. ? To move around, walk, or change positions during labor and delivery.  What to expect after your baby is born, such as: ? Whether delayed umbilical cord clamping and cutting is offered. ? Who will care for your baby right after birth. ? Medicines or tests that may be recommended for your baby. ? Whether breastfeeding is supported in your hospital or birth center. ? How long you will be in the hospital or birth center.  How any medical conditions you have may affect your baby or your labor and delivery experience.  To prepare for your baby's birth, you should also:  Attend all of your health care visits before delivery (prenatal visits) as recommended by your health care provider. This is important.  Prepare your home for your baby's  arrival. Make sure that you have: ? Diapers. ? Baby clothing. ? Feeding equipment. ? Safe sleeping arrangements for you and your baby.  Install a car seat in your vehicle. Have your car seat checked by a certified car seat installer to make sure that it is installed safely.  Think about who will help you with your new baby at home for at least the first several weeks after delivery.  What can I expect when I arrive at the birth center or hospital? Once you are in labor and have been admitted into the hospital or birth center, your health care provider may:  Review your pregnancy history and any concerns you have.  Insert an IV tube into one of your veins. This is used to give you fluids and medicines.  Check your blood pressure, pulse, temperature, and heart rate (vital signs).  Check whether your bag of water (amniotic sac) has broken (ruptured).  Talk with you about your birth plan and discuss pain control options.  Monitoring Your health care provider may monitor your contractions (uterine monitoring) and your baby's heart rate (fetal monitoring). You may need to be monitored:  Often, but not continuously (intermittently).  All the time or for long periods at a time (continuously). Continuous monitoring may be needed if: ? You are taking certain medicines, such as medicine to relieve pain or make your contractions stronger. ? You have pregnancy or labor complications.  Monitoring may be done by:  Placing a special stethoscope or a handheld monitoring device on your abdomen to   check your baby's heartbeat, and feeling your abdomen for contractions. This method of monitoring does not continuously record your baby's heartbeat or your contractions.  Placing monitors on your abdomen (external monitors) to record your baby's heartbeat and the frequency and length of contractions. You may not have to wear external monitors all the time.  Placing monitors inside of your uterus  (internal monitors) to record your baby's heartbeat and the frequency, length, and strength of your contractions. ? Your health care provider may use internal monitors if he or she needs more information about the strength of your contractions or your baby's heart rate. ? Internal monitors are put in place by passing a thin, flexible wire through your vagina and into your uterus. Depending on the type of monitor, it may remain in your uterus or on your baby's head until birth. ? Your health care provider will discuss the benefits and risks of internal monitoring with you and will ask for your permission before inserting the monitors.  Telemetry. This is a type of continuous monitoring that can be done with external or internal monitors. Instead of having to stay in bed, you are able to move around during telemetry. Ask your health care provider if telemetry is an option for you.  Physical exam Your health care provider may perform a physical exam. This may include:  Checking whether your baby is positioned: ? With the head toward your vagina (head-down). This is most common. ? With the head toward the top of your uterus (head-up or breech). If your baby is in a breech position, your health care provider may try to turn your baby to a head-down position so you can deliver vaginally. If it does not seem that your baby can be born vaginally, your provider may recommend surgery to deliver your baby. In rare cases, you may be able to deliver vaginally if your baby is head-up (breech delivery). ? Lying sideways (transverse). Babies that are lying sideways cannot be delivered vaginally.  Checking your cervix to determine: ? Whether it is thinning out (effacing). ? Whether it is opening up (dilating). ? How low your baby has moved into your birth canal.  What are the three stages of labor and delivery?  Normal labor and delivery is divided into the following three stages: Stage 1  Stage 1 is the  longest stage of labor, and it can last for hours or days. Stage 1 includes: ? Early labor. This is when contractions may be irregular, or regular and mild. Generally, early labor contractions are more than 10 minutes apart. ? Active labor. This is when contractions get longer, more regular, more frequent, and more intense. ? The transition phase. This is when contractions happen very close together, are very intense, and may last longer than during any other part of labor.  Contractions generally feel mild, infrequent, and irregular at first. They get stronger, more frequent (about every 2-3 minutes), and more regular as you progress from early labor through active labor and transition.  Many women progress through stage 1 naturally, but you may need help to continue making progress. If this happens, your health care provider may talk with you about: ? Rupturing your amniotic sac if it has not ruptured yet. ? Giving you medicine to help make your contractions stronger and more frequent.  Stage 1 ends when your cervix is completely dilated to 4 inches (10 cm) and completely effaced. This happens at the end of the transition phase. Stage 2  Once   your cervix is completely effaced and dilated to 4 inches (10 cm), you may start to feel an urge to push. It is common for the body to naturally take a rest before feeling the urge to push, especially if you received an epidural or certain other pain medicines. This rest period may last for up to 1-2 hours, depending on your unique labor experience.  During stage 2, contractions are generally less painful, because pushing helps relieve contraction pain. Instead of contraction pain, you may feel stretching and burning pain, especially when the widest part of your baby's head passes through the vaginal opening (crowning).  Your health care provider will closely monitor your pushing progress and your baby's progress through the vagina during stage 2.  Your  health care provider may massage the area of skin between your vaginal opening and anus (perineum) or apply warm compresses to your perineum. This helps it stretch as the baby's head starts to crown, which can help prevent perineal tearing. ? In some cases, an incision may be made in your perineum (episiotomy) to allow the baby to pass through the vaginal opening. An episiotomy helps to make the opening of the vagina larger to allow more room for the baby to fit through.  It is very important to breathe and focus so your health care provider can control the delivery of your baby's head. Your health care provider may have you decrease the intensity of your pushing, to help prevent perineal tearing.  After delivery of your baby's head, the shoulders and the rest of the body generally deliver very quickly and without difficulty.  Once your baby is delivered, the umbilical cord may be cut right away, or this may be delayed for 1-2 minutes, depending on your baby's health. This may vary among health care providers, hospitals, and birth centers.  If you and your baby are healthy enough, your baby may be placed on your chest or abdomen to help maintain the baby's temperature and to help you bond with each other. Some mothers and babies start breastfeeding at this time. Your health care team will dry your baby and help keep your baby warm during this time.  Your baby may need immediate care if he or she: ? Showed signs of distress during labor. ? Has a medical condition. ? Was born too early (prematurely). ? Had a bowel movement before birth (meconium). ? Shows signs of difficulty transitioning from being inside the uterus to being outside of the uterus. If you are planning to breastfeed, your health care team will help you begin a feeding. Stage 3  The third stage of labor starts immediately after the birth of your baby and ends after you deliver the placenta. The placenta is an organ that develops  during pregnancy to provide oxygen and nutrients to your baby in the womb.  Delivering the placenta may require some pushing, and you may have mild contractions. Breastfeeding can stimulate contractions to help you deliver the placenta.  After the placenta is delivered, your uterus should tighten (contract) and become firm. This helps to stop bleeding in your uterus. To help your uterus contract and to control bleeding, your health care provider may: ? Give you medicine by injection, through an IV tube, by mouth, or through your rectum (rectally). ? Massage your abdomen or perform a vaginal exam to remove any blood clots that are left in your uterus. ? Empty your bladder by placing a thin, flexible tube (catheter) into your bladder. ? Encourage   you to breastfeed your baby. After labor is over, you and your baby will be monitored closely to ensure that you are both healthy until you are ready to go home. Your health care team will teach you how to care for yourself and your baby. This information is not intended to replace advice given to you by your health care provider. Make sure you discuss any questions you have with your health care provider. Document Released: 04/21/2008 Document Revised: 01/31/2016 Document Reviewed: 07/28/2015 Elsevier Interactive Patient Education  2018 [employer]  

## 2018-02-26 ENCOUNTER — Encounter: Payer: Self-pay | Admitting: Advanced Practice Midwife

## 2018-02-28 ENCOUNTER — Ambulatory Visit (INDEPENDENT_AMBULATORY_CARE_PROVIDER_SITE_OTHER): Payer: Medicaid Other | Admitting: Obstetrics & Gynecology

## 2018-02-28 VITALS — BP 93/59 | HR 84 | Wt 254.1 lb

## 2018-02-28 DIAGNOSIS — Z3480 Encounter for supervision of other normal pregnancy, unspecified trimester: Secondary | ICD-10-CM

## 2018-02-28 NOTE — Progress Notes (Signed)
   PRENATAL VISIT NOTE  Subjective:  Katherine Shaw is a 27 y.o. G3P1012 at 3459w5d being seen today for ongoing prenatal care.  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  Patient reports occasional contractions.  Contractions: Irregular. Vag. Bleeding: None.  Movement: Present. Denies leaking of fluid.   The following portions of the patient's history were reviewed and updated as appropriate: allergies, current medications, past family history, past medical history, past social history, past surgical history and problem list. Problem list updated.  Objective:   Vitals:   02/28/18 0848  BP: (!) 93/59  Pulse: 84  Weight: 254 lb 1.3 oz (115.2 kg)    Fetal Status: Fetal Heart Rate (bpm): 133 Fundal Height: 39 cm Movement: Present  Presentation: Vertex  General:  Alert, oriented and cooperative. Patient is in no acute distress.  Skin: Skin is warm and dry. No rash noted.   Cardiovascular: Normal heart rate noted  Respiratory: Normal respiratory effort, no problems with respiration noted  Abdomen: Soft, gravid, appropriate for gestational age.  Pain/Pressure: Present     Pelvic: Cervical exam performed Dilation: 3 Effacement (%): 60 Station: -2  Extremities: Normal range of motion.  Edema: None  Mental Status: Normal mood and affect. Normal behavior. Normal judgment and thought content.   Assessment and Plan:  Pregnancy: G3P1012 at 4759w5d  1. Supervision of other normal pregnancy, antepartum Term labor symptoms and general obstetric precautions including but not limited to vaginal bleeding, contractions, leaking of fluid and fetal  movement were reviewed in detail with the patient. Please refer to After Visit Summary for other counseling recommendations.  Return in about 1 week (around 03/07/2018) for OB Visit.  No future appointments.  Jaynie CollinsUgonna Kalah Pflum, MD

## 2018-02-28 NOTE — Patient Instructions (Signed)
Return to clinic for any scheduled appointments or obstetric concerns, or go to [facility] for evaluation  

## 2018-03-02 ENCOUNTER — Telehealth: Payer: Self-pay

## 2018-03-02 ENCOUNTER — Encounter: Payer: Self-pay | Admitting: Family Medicine

## 2018-03-02 NOTE — Telephone Encounter (Signed)
Patient called requesting if she could go ahead and have an 41 wk  induction date scheduled. Patient does not want to wait until next appt to schedule this. Made her aware we will schedule and call her back. Armandina StammerJennifer Ata Pecha RN

## 2018-03-03 ENCOUNTER — Encounter (HOSPITAL_COMMUNITY): Payer: Self-pay | Admitting: *Deleted

## 2018-03-03 ENCOUNTER — Telehealth (HOSPITAL_COMMUNITY): Payer: Self-pay | Admitting: *Deleted

## 2018-03-03 NOTE — Telephone Encounter (Signed)
Preadmission screen  

## 2018-03-06 ENCOUNTER — Encounter (HOSPITAL_COMMUNITY): Payer: Self-pay

## 2018-03-06 ENCOUNTER — Inpatient Hospital Stay (HOSPITAL_COMMUNITY)
Admission: AD | Admit: 2018-03-06 | Discharge: 2018-03-06 | Disposition: A | Discharge status: Home / Self Care | Payer: Medicaid Other | Source: Ambulatory Visit | Attending: Obstetrics and Gynecology | Admitting: Obstetrics and Gynecology

## 2018-03-06 ENCOUNTER — Other Ambulatory Visit: Payer: Self-pay

## 2018-03-06 DIAGNOSIS — N898 Other specified noninflammatory disorders of vagina: Secondary | ICD-10-CM

## 2018-03-06 DIAGNOSIS — O99330 Smoking (tobacco) complicating pregnancy, unspecified trimester: Secondary | ICD-10-CM

## 2018-03-06 DIAGNOSIS — O99283 Endocrine, nutritional and metabolic diseases complicating pregnancy, third trimester: Secondary | ICD-10-CM | POA: Diagnosis not present

## 2018-03-06 DIAGNOSIS — O26893 Other specified pregnancy related conditions, third trimester: Secondary | ICD-10-CM

## 2018-03-06 DIAGNOSIS — O99333 Smoking (tobacco) complicating pregnancy, third trimester: Secondary | ICD-10-CM | POA: Insufficient documentation

## 2018-03-06 DIAGNOSIS — O99013 Anemia complicating pregnancy, third trimester: Secondary | ICD-10-CM | POA: Diagnosis not present

## 2018-03-06 DIAGNOSIS — E039 Hypothyroidism, unspecified: Secondary | ICD-10-CM | POA: Insufficient documentation

## 2018-03-06 DIAGNOSIS — O35EXX0 Maternal care for other (suspected) fetal abnormality and damage, fetal genitourinary anomalies, not applicable or unspecified: Secondary | ICD-10-CM

## 2018-03-06 DIAGNOSIS — O99282 Endocrine, nutritional and metabolic diseases complicating pregnancy, second trimester: Secondary | ICD-10-CM

## 2018-03-06 DIAGNOSIS — D573 Sickle-cell trait: Secondary | ICD-10-CM | POA: Diagnosis not present

## 2018-03-06 DIAGNOSIS — O471 False labor at or after 37 completed weeks of gestation: Secondary | ICD-10-CM

## 2018-03-06 DIAGNOSIS — O358XX0 Maternal care for other (suspected) fetal abnormality and damage, not applicable or unspecified: Secondary | ICD-10-CM

## 2018-03-06 DIAGNOSIS — Z3A40 40 weeks gestation of pregnancy: Secondary | ICD-10-CM | POA: Diagnosis not present

## 2018-03-06 LAB — POCT FERN TEST: POCT Fern Test: NEGATIVE

## 2018-03-06 NOTE — Discharge Instructions (Signed)
Braxton Hicks Contractions °Contractions of the uterus can occur throughout pregnancy, but they are not always a sign that you are in labor. You may have practice contractions called Braxton Hicks contractions. These false labor contractions are sometimes confused with true labor. °What are Braxton Hicks contractions? °Braxton Hicks contractions are tightening movements that occur in the muscles of the uterus before labor. Unlike true labor contractions, these contractions do not result in opening (dilation) and thinning of the cervix. Toward the end of pregnancy (32-34 weeks), Braxton Hicks contractions can happen more often and may become stronger. These contractions are sometimes difficult to tell apart from true labor because they can be very uncomfortable. You should not feel embarrassed if you go to the hospital with false labor. °Sometimes, the only way to tell if you are in true labor is for your health care provider to look for changes in the cervix. The health care provider will do a physical exam and may monitor your contractions. If you are not in true labor, the exam should show that your cervix is not dilating and your water has not broken. °If there are other health problems associated with your pregnancy, it is completely safe for you to be sent home with false labor. You may continue to have Braxton Hicks contractions until you go into true labor. °How to tell the difference between true labor and false labor °True labor °· Contractions last 30-70 seconds. °· Contractions become very regular. °· Discomfort is usually felt in the top of the uterus, and it spreads to the lower abdomen and low back. °· Contractions do not go away with walking. °· Contractions usually become more intense and increase in frequency. °· The cervix dilates and gets thinner. °False labor °· Contractions are usually shorter and not as strong as true labor contractions. °· Contractions are usually irregular. °· Contractions  are often felt in the front of the lower abdomen and in the groin. °· Contractions may go away when you walk around or change positions while lying down. °· Contractions get weaker and are shorter-lasting as time goes on. °· The cervix usually does not dilate or become thin. °Follow these instructions at home: °· Take over-the-counter and prescription medicines only as told by your health care provider. °· Keep up with your usual exercises and follow other instructions from your health care provider. °· Eat and drink lightly if you think you are going into labor. °· If Braxton Hicks contractions are making you uncomfortable: °? Change your position from lying down or resting to walking, or change from walking to resting. °? Sit and rest in a tub of warm water. °? Drink enough fluid to keep your urine pale yellow. Dehydration may cause these contractions. °? Do slow and deep breathing several times an hour. °· Keep all follow-up prenatal visits as told by your health care provider. This is important. °Contact a health care provider if: °· You have a fever. °· You have continuous pain in your abdomen. °Get help right away if: °· Your contractions become stronger, more regular, and closer together. °· You have fluid leaking or gushing from your vagina. °· You pass blood-tinged mucus (bloody show). °· You have bleeding from your vagina. °· You have low back pain that you never had before. °· You feel your baby’s head pushing down and causing pelvic pressure. °· Your baby is not moving inside you as much as it used to. °Summary °· Contractions that occur before labor are called Braxton   Hicks contractions, false labor, or practice contractions. °· Braxton Hicks contractions are usually shorter, weaker, farther apart, and less regular than true labor contractions. True labor contractions usually become progressively stronger and regular and they become more frequent. °· Manage discomfort from Braxton Hicks contractions by  changing position, resting in a warm bath, drinking plenty of water, or practicing deep breathing. °This information is not intended to replace advice given to you by your health care provider. Make sure you discuss any questions you have with your health care provider. °Document Released: 11/26/2016 Document Revised: 11/26/2016 Document Reviewed: 11/26/2016 °Elsevier Interactive Patient Education © 2018 [employer] Inc[employer] ° °

## 2018-03-06 NOTE — [facility] Note (Signed)
Pt presents with c/o LOF @ 1700, reports gush of fluid but none since then.  Reports regular ctxs since 1730.  Denies VB.  Reports +FM.

## 2018-03-10 ENCOUNTER — Ambulatory Visit (INDEPENDENT_AMBULATORY_CARE_PROVIDER_SITE_OTHER): Payer: Medicaid Other | Admitting: Family Medicine

## 2018-03-10 VITALS — BP 95/57 | HR 72 | Wt 254.0 lb

## 2018-03-10 DIAGNOSIS — Z3480 Encounter for supervision of other normal pregnancy, unspecified trimester: Secondary | ICD-10-CM

## 2018-03-10 DIAGNOSIS — O99282 Endocrine, nutritional and metabolic diseases complicating pregnancy, second trimester: Secondary | ICD-10-CM

## 2018-03-10 DIAGNOSIS — O99820 Streptococcus B carrier state complicating pregnancy: Secondary | ICD-10-CM | POA: Insufficient documentation

## 2018-03-10 DIAGNOSIS — E039 Hypothyroidism, unspecified: Secondary | ICD-10-CM

## 2018-03-10 DIAGNOSIS — D573 Sickle-cell trait: Secondary | ICD-10-CM

## 2018-03-10 NOTE — Progress Notes (Signed)
   PRENATAL VISIT NOTE  Subjective:  Katherine Shaw is a 27 y.o. G3P1012 at 4820w1d being seen today for ongoing prenatal care.  She is currently monitored for the following issues for this low-risk pregnancy and has Supervision of normal pregnancy, antepartum; Maternal tobacco use, antepartum; Hypothyroidism affecting pregnancy in second trimester; Sickle cell trait (HCC); Ganglion cyst of dorsum of left wrist; Ultrasound recheck of fetal pyelectasis, antepartum, not applicable or unspecified fetus; and GBS (group B Streptococcus carrier), +RV culture, currently pregnant on their problem list.  Patient reports no complaints.  Contractions: Irregular. Vag. Bleeding: None.  Movement: Present. Denies leaking of fluid.   The following portions of the patient's history were reviewed and updated as appropriate: allergies, current medications, past family history, past medical history, past social history, past surgical history and problem list. Problem list updated.  Objective:   Vitals:   03/10/18 0852  BP: (!) 95/57  Pulse: 72  Weight: 254 lb (115.2 kg)    Fetal Status: Fetal Heart Rate (bpm): 136 Fundal Height: 40 cm Movement: Present     General:  Alert, oriented and cooperative. Patient is in no acute distress.  Skin: Skin is warm and dry. No rash noted.   Cardiovascular: Normal heart rate noted  Respiratory: Normal respiratory effort, no problems with respiration noted  Abdomen: Soft, gravid, appropriate for gestational age.  Pain/Pressure: Present     Pelvic: Cervical exam performed Dilation: 3 Effacement (%): 60 Station: -2  Extremities: Normal range of motion.  Edema: Trace  Mental Status: Normal mood and affect. Normal behavior. Normal judgment and thought content.   Assessment and Plan:  Pregnancy: G3P1012 at 5420w1d  1. Supervision of other normal pregnancy, antepartum Induction moved to tonight per patient's request  2. Hypothyroidism affecting pregnancy in second  trimester Taking synthroid  3. Sickle cell trait (HCC)  4. GBS (group B Streptococcus carrier), +RV culture, currently pregnant Intrapartum prophylaxis.  Term labor symptoms and general obstetric precautions including but not limited to vaginal bleeding, contractions, leaking of fluid and fetal movement were reviewed in detail with the patient. Please refer to After Visit Summary for other counseling recommendations.  Return in about 5 weeks (around 04/14/2018) for Postpartum Exam.  Future Appointments  Date Time Provider Department Center  03/11/2018 12:00 AM [facility] None  04/15/2018  8:15 AM Levie HeritageStinson, Marleah Beever J, DO [facility] None    Levie HeritageJacob J Cleta Heatley, DO

## 2018-03-11 ENCOUNTER — Inpatient Hospital Stay (HOSPITAL_COMMUNITY): Payer: Medicaid Other | Admitting: Anesthesiology

## 2018-03-11 ENCOUNTER — Encounter (HOSPITAL_COMMUNITY)
Admission: RE | Disposition: A | Discharge status: Home / Self Care | Payer: Self-pay | Source: Home / Self Care | Attending: Obstetrics and Gynecology

## 2018-03-11 ENCOUNTER — Encounter (HOSPITAL_COMMUNITY): Payer: Self-pay

## 2018-03-11 ENCOUNTER — Inpatient Hospital Stay (HOSPITAL_COMMUNITY)
Admission: RE | Admit: 2018-03-11 | Discharge: 2018-03-13 | DRG: 798 | Disposition: A | Discharge status: Home / Self Care | Payer: Medicaid Other | Attending: Obstetrics and Gynecology | Admitting: Obstetrics and Gynecology

## 2018-03-11 DIAGNOSIS — Z87891 Personal history of nicotine dependence: Secondary | ICD-10-CM

## 2018-03-11 DIAGNOSIS — O26893 Other specified pregnancy related conditions, third trimester: Secondary | ICD-10-CM | POA: Diagnosis present

## 2018-03-11 DIAGNOSIS — Z3A40 40 weeks gestation of pregnancy: Secondary | ICD-10-CM

## 2018-03-11 DIAGNOSIS — O9902 Anemia complicating childbirth: Secondary | ICD-10-CM | POA: Diagnosis present

## 2018-03-11 DIAGNOSIS — O99824 Streptococcus B carrier state complicating childbirth: Secondary | ICD-10-CM | POA: Diagnosis present

## 2018-03-11 DIAGNOSIS — E039 Hypothyroidism, unspecified: Secondary | ICD-10-CM | POA: Diagnosis present

## 2018-03-11 DIAGNOSIS — Z302 Encounter for sterilization: Secondary | ICD-10-CM

## 2018-03-11 DIAGNOSIS — D649 Anemia, unspecified: Secondary | ICD-10-CM | POA: Diagnosis present

## 2018-03-11 DIAGNOSIS — O99214 Obesity complicating childbirth: Secondary | ICD-10-CM | POA: Diagnosis present

## 2018-03-11 DIAGNOSIS — Z3490 Encounter for supervision of normal pregnancy, unspecified, unspecified trimester: Secondary | ICD-10-CM

## 2018-03-11 DIAGNOSIS — O99284 Endocrine, nutritional and metabolic diseases complicating childbirth: Secondary | ICD-10-CM | POA: Diagnosis present

## 2018-03-11 HISTORY — PX: TUBAL LIGATION: SHX77

## 2018-03-11 LAB — CBC
HCT: 29.6 % — ABNORMAL LOW (ref 36.0–46.0)
Hemoglobin: 10.3 g/dL — ABNORMAL LOW (ref 12.0–15.0)
MCH: 30 pg (ref 26.0–34.0)
MCHC: 34.8 g/dL (ref 30.0–36.0)
MCV: 86.3 fL (ref 78.0–100.0)
PLATELETS: 176 10*3/uL (ref 150–400)
RBC: 3.43 MIL/uL — ABNORMAL LOW (ref 3.87–5.11)
RDW: 14.1 % (ref 11.5–15.5)
WBC: 9.7 10*3/uL (ref 4.0–10.5)

## 2018-03-11 LAB — RPR: RPR Ser Ql: NONREACTIVE

## 2018-03-11 LAB — TYPE AND SCREEN
ABO/RH(D): O POS
Antibody Screen: NEGATIVE

## 2018-03-11 SURGERY — LIGATION, FALLOPIAN TUBE, POSTPARTUM
Anesthesia: Epidural | Site: Abdomen | Laterality: Bilateral | Wound class: Clean Contaminated

## 2018-03-11 MED ORDER — OXYTOCIN BOLUS FROM INFUSION
500.0000 mL | Freq: Once | INTRAVENOUS | Status: AC
  Administered 2018-03-11: 500 mL via INTRAVENOUS

## 2018-03-11 MED ORDER — PROMETHAZINE HCL 25 MG/ML IJ SOLN
12.5000 mg | Freq: Once | INTRAMUSCULAR | Status: AC
  Administered 2018-03-11: 12.5 mg via INTRAVENOUS
  Filled 2018-03-11: qty 1

## 2018-03-11 MED ORDER — OXYCODONE-ACETAMINOPHEN 5-325 MG PO TABS: 1.0000 | ORAL_TABLET | ORAL | Status: DC | PRN

## 2018-03-11 MED ORDER — PENICILLIN G 3 MILLION UNITS IVPB - SIMPLE MED
3.0000 10*6.[IU] | INTRAVENOUS | Status: DC
  Administered 2018-03-11 (×2): 3 10*6.[IU] via INTRAVENOUS
  Filled 2018-03-11: qty 100
  Filled 2018-03-11: qty 3
  Filled 2018-03-11 (×3): qty 100
  Filled 2018-03-11: qty 3

## 2018-03-11 MED ORDER — IBUPROFEN 600 MG PO TABS
600.0000 mg | ORAL_TABLET | Freq: Four times a day (QID) | ORAL | Status: DC
  Administered 2018-03-11 – 2018-03-13 (×7): 600 mg via ORAL
  Filled 2018-03-11 (×7): qty 1

## 2018-03-11 MED ORDER — LACTATED RINGERS IV SOLN
500.0000 mL | INTRAVENOUS | Status: DC | PRN
  Administered 2018-03-11: 500 mL via INTRAVENOUS

## 2018-03-11 MED ORDER — WITCH HAZEL-GLYCERIN EX PADS: 1.0000 "application " | MEDICATED_PAD | CUTANEOUS | Status: DC | PRN

## 2018-03-11 MED ORDER — LACTATED RINGERS IV SOLN: 500.0000 mL | Freq: Once | INTRAVENOUS | Status: DC

## 2018-03-11 MED ORDER — TERBUTALINE SULFATE 1 MG/ML IJ SOLN: 0.2500 mg | Freq: Once | INTRAMUSCULAR | Status: DC | PRN

## 2018-03-11 MED ORDER — OXYTOCIN 40 UNITS IN LACTATED RINGERS INFUSION - SIMPLE MED: 2.5000 [IU]/h | INTRAVENOUS | Status: DC

## 2018-03-11 MED ORDER — SIMETHICONE 80 MG PO CHEW
80.0000 mg | CHEWABLE_TABLET | ORAL | Status: DC | PRN
  Administered 2018-03-12: 80 mg via ORAL
  Filled 2018-03-11: qty 1

## 2018-03-11 MED ORDER — BUPIVACAINE HCL 0.5 % IJ SOLN
INTRAMUSCULAR | Status: DC | PRN
  Administered 2018-03-11: 30 mL

## 2018-03-11 MED ORDER — PHENYLEPHRINE 40 MCG/ML (10ML) SYRINGE FOR IV PUSH (FOR BLOOD PRESSURE SUPPORT): 80.0000 ug | PREFILLED_SYRINGE | INTRAVENOUS | Status: DC | PRN

## 2018-03-11 MED ORDER — LIDOCAINE HCL (PF) 1 % IJ SOLN
30.0000 mL | INTRAMUSCULAR | Status: DC | PRN
  Filled 2018-03-11: qty 30

## 2018-03-11 MED ORDER — LIDOCAINE HCL (PF) 1 % IJ SOLN
INTRAMUSCULAR | Status: DC | PRN
  Administered 2018-03-11: 5 mL via EPIDURAL

## 2018-03-11 MED ORDER — EPHEDRINE 5 MG/ML INJ: 10.0000 mg | INTRAVENOUS | Status: DC | PRN

## 2018-03-11 MED ORDER — BUPIVACAINE HCL (PF) 0.5 % IJ SOLN
INTRAMUSCULAR | Status: AC
  Filled 2018-03-11: qty 30

## 2018-03-11 MED ORDER — PENICILLIN G POTASSIUM 5000000 UNITS IJ SOLR
5.0000 10*6.[IU] | Freq: Once | INTRAMUSCULAR | Status: AC
  Administered 2018-03-11: 5 10*6.[IU] via INTRAVENOUS
  Filled 2018-03-11: qty 5

## 2018-03-11 MED ORDER — ACETAMINOPHEN 325 MG PO TABS
650.0000 mg | ORAL_TABLET | ORAL | Status: DC | PRN
  Administered 2018-03-11 – 2018-03-13 (×3): 650 mg via ORAL
  Filled 2018-03-11 (×3): qty 2

## 2018-03-11 MED ORDER — DIBUCAINE 1 % RE OINT: 1.0000 "application " | TOPICAL_OINTMENT | RECTAL | Status: DC | PRN

## 2018-03-11 MED ORDER — OXYTOCIN 40 UNITS IN LACTATED RINGERS INFUSION - SIMPLE MED
1.0000 m[IU]/min | INTRAVENOUS | Status: DC
  Administered 2018-03-11: 2 m[IU]/min via INTRAVENOUS
  Filled 2018-03-11: qty 1000

## 2018-03-11 MED ORDER — ONDANSETRON HCL 4 MG/2ML IJ SOLN: 4.0000 mg | INTRAMUSCULAR | Status: DC | PRN

## 2018-03-11 MED ORDER — FENTANYL 2.5 MCG/ML BUPIVACAINE 1/10 % EPIDURAL INFUSION (WH - ANES)
14.0000 mL/h | INTRAMUSCULAR | Status: DC | PRN
  Administered 2018-03-11: 14 mL/h via EPIDURAL

## 2018-03-11 MED ORDER — PHENYLEPHRINE 40 MCG/ML (10ML) SYRINGE FOR IV PUSH (FOR BLOOD PRESSURE SUPPORT)
PREFILLED_SYRINGE | INTRAVENOUS | Status: AC
  Filled 2018-03-11: qty 20

## 2018-03-11 MED ORDER — SODIUM CHLORIDE 0.9 % IR SOLN
Status: DC | PRN
  Administered 2018-03-11: 50 mL

## 2018-03-11 MED ORDER — FENTANYL CITRATE (PF) 100 MCG/2ML IJ SOLN
100.0000 ug | INTRAMUSCULAR | Status: DC | PRN
  Administered 2018-03-11: 100 ug via INTRAVENOUS
  Filled 2018-03-11: qty 2

## 2018-03-11 MED ORDER — LEVOTHYROXINE SODIUM 50 MCG PO TABS: 50.0000 ug | ORAL_TABLET | Freq: Every day | ORAL | Status: DC

## 2018-03-11 MED ORDER — ONDANSETRON HCL 4 MG/2ML IJ SOLN
INTRAMUSCULAR | Status: AC
  Filled 2018-03-11: qty 2

## 2018-03-11 MED ORDER — ONDANSETRON HCL 4 MG PO TABS: 4.0000 mg | ORAL_TABLET | ORAL | Status: DC | PRN

## 2018-03-11 MED ORDER — ZOLPIDEM TARTRATE 5 MG PO TABS: 5.0000 mg | ORAL_TABLET | Freq: Every evening | ORAL | Status: DC | PRN

## 2018-03-11 MED ORDER — SODIUM BICARBONATE 8.4 % IV SOLN
INTRAVENOUS | Status: DC | PRN
  Administered 2018-03-11 (×4): 5 mL via EPIDURAL

## 2018-03-11 MED ORDER — FENTANYL CITRATE (PF) 100 MCG/2ML IJ SOLN
INTRAMUSCULAR | Status: AC
  Filled 2018-03-11: qty 2

## 2018-03-11 MED ORDER — MIDAZOLAM HCL 2 MG/2ML IJ SOLN
INTRAMUSCULAR | Status: DC | PRN
  Administered 2018-03-11 (×2): 1 mg via INTRAVENOUS

## 2018-03-11 MED ORDER — BENZOCAINE-MENTHOL 20-0.5 % EX AERO: 1.0000 "application " | INHALATION_SPRAY | CUTANEOUS | Status: DC | PRN

## 2018-03-11 MED ORDER — PHENYLEPHRINE 40 MCG/ML (10ML) SYRINGE FOR IV PUSH (FOR BLOOD PRESSURE SUPPORT)
80.0000 ug | PREFILLED_SYRINGE | INTRAVENOUS | Status: DC | PRN
  Administered 2018-03-11: 80 ug via INTRAVENOUS

## 2018-03-11 MED ORDER — COCONUT OIL OIL
1.0000 "application " | TOPICAL_OIL | Status: DC | PRN
  Filled 2018-03-11: qty 120

## 2018-03-11 MED ORDER — ACETAMINOPHEN 325 MG PO TABS: 650.0000 mg | ORAL_TABLET | ORAL | Status: DC | PRN

## 2018-03-11 MED ORDER — LACTATED RINGERS IV SOLN
500.0000 mL | Freq: Once | INTRAVENOUS | Status: AC
  Administered 2018-03-11: 500 mL via INTRAVENOUS

## 2018-03-11 MED ORDER — LACTATED RINGERS IV SOLN
INTRAVENOUS | Status: DC | PRN
  Administered 2018-03-11: 13:00:00 via INTRAVENOUS

## 2018-03-11 MED ORDER — FENTANYL CITRATE (PF) 100 MCG/2ML IJ SOLN
INTRAMUSCULAR | Status: DC | PRN
  Administered 2018-03-11 (×2): 50 ug via INTRAVENOUS

## 2018-03-11 MED ORDER — LACTATED RINGERS IV SOLN
INTRAVENOUS | Status: DC
  Administered 2018-03-11 (×3): via INTRAVENOUS

## 2018-03-11 MED ORDER — PRENATAL MULTIVITAMIN CH
1.0000 | ORAL_TABLET | Freq: Every day | ORAL | Status: DC
  Administered 2018-03-12: 1 via ORAL
  Filled 2018-03-11: qty 1

## 2018-03-11 MED ORDER — ONDANSETRON HCL 4 MG/2ML IJ SOLN
INTRAMUSCULAR | Status: DC | PRN
  Administered 2018-03-11: 4 mg via INTRAVENOUS

## 2018-03-11 MED ORDER — TETANUS-DIPHTH-ACELL PERTUSSIS 5-2.5-18.5 LF-MCG/0.5 IM SUSP: 0.5000 mL | Freq: Once | INTRAMUSCULAR | Status: DC

## 2018-03-11 MED ORDER — ONDANSETRON HCL 4 MG/2ML IJ SOLN
4.0000 mg | Freq: Four times a day (QID) | INTRAMUSCULAR | Status: DC | PRN
  Administered 2018-03-11: 4 mg via INTRAVENOUS
  Filled 2018-03-11: qty 2

## 2018-03-11 MED ORDER — DIPHENHYDRAMINE HCL 50 MG/ML IJ SOLN: 12.5000 mg | INTRAMUSCULAR | Status: DC | PRN

## 2018-03-11 MED ORDER — SODIUM BICARBONATE 8.4 % IV SOLN
INTRAVENOUS | Status: AC
Start: 2018-03-11 — End: ?
  Filled 2018-03-11: qty 50

## 2018-03-11 MED ORDER — LEVOTHYROXINE SODIUM 50 MCG PO TABS
50.0000 ug | ORAL_TABLET | Freq: Every day | ORAL | Status: DC
  Administered 2018-03-11 – 2018-03-13 (×2): 50 ug via ORAL
  Filled 2018-03-11 (×3): qty 1

## 2018-03-11 MED ORDER — OXYCODONE HCL 5 MG PO TABS
5.0000 mg | ORAL_TABLET | ORAL | Status: DC | PRN
  Administered 2018-03-11 – 2018-03-13 (×4): 5 mg via ORAL
  Filled 2018-03-11 (×4): qty 1

## 2018-03-11 MED ORDER — SENNOSIDES-DOCUSATE SODIUM 8.6-50 MG PO TABS
2.0000 | ORAL_TABLET | ORAL | Status: DC
  Administered 2018-03-12 (×2): 2 via ORAL
  Filled 2018-03-11 (×2): qty 2

## 2018-03-11 MED ORDER — LIDOCAINE-EPINEPHRINE (PF) 2 %-1:200000 IJ SOLN
INTRAMUSCULAR | Status: AC
  Filled 2018-03-11: qty 20

## 2018-03-11 MED ORDER — SOD CITRATE-CITRIC ACID 500-334 MG/5ML PO SOLN: 30.0000 mL | ORAL | Status: DC | PRN

## 2018-03-11 MED ORDER — FENTANYL 2.5 MCG/ML BUPIVACAINE 1/10 % EPIDURAL INFUSION (WH - ANES)
INTRAMUSCULAR | Status: AC
  Filled 2018-03-11: qty 100

## 2018-03-11 MED ORDER — MIDAZOLAM HCL 2 MG/2ML IJ SOLN
INTRAMUSCULAR | Status: AC
  Filled 2018-03-11: qty 2

## 2018-03-11 MED ORDER — DIPHENHYDRAMINE HCL 25 MG PO CAPS: 25.0000 mg | ORAL_CAPSULE | Freq: Four times a day (QID) | ORAL | Status: DC | PRN

## 2018-03-11 MED ORDER — OXYCODONE-ACETAMINOPHEN 5-325 MG PO TABS: 2.0000 | ORAL_TABLET | ORAL | Status: DC | PRN

## 2018-03-11 SURGICAL SUPPLY — 29 items
CLIP FILSHIE TUBAL LIGA STRL (Clip) ×4 IMPLANT
CLOTH BEACON ORANGE TIMEOUT ST (SAFETY) ×2 IMPLANT
DERMABOND ADVANCED (GAUZE/BANDAGES/DRESSINGS) ×1
DERMABOND ADVANCED .7 DNX12 (GAUZE/BANDAGES/DRESSINGS) ×1 IMPLANT
DRSG OPSITE POSTOP 3X4 (GAUZE/BANDAGES/DRESSINGS) ×2 IMPLANT
DURAPREP 26ML APPLICATOR (WOUND CARE) ×2 IMPLANT
ELECT REM PT RETURN 9FT ADLT (ELECTROSURGICAL) ×2
ELECTRODE REM PT RTRN 9FT ADLT (ELECTROSURGICAL) ×1 IMPLANT
GLOVE BIO SURGEON STRL SZ7 (GLOVE) ×2 IMPLANT
GLOVE BIOGEL PI IND STRL 7.0 (GLOVE) ×1 IMPLANT
GLOVE BIOGEL PI IND STRL 7.5 (GLOVE) ×1 IMPLANT
GLOVE BIOGEL PI INDICATOR 7.0 (GLOVE) ×1
GLOVE BIOGEL PI INDICATOR 7.5 (GLOVE) ×1
GOWN STRL REUS W/TWL LRG LVL3 (GOWN DISPOSABLE) ×4 IMPLANT
NEEDLE HYPO 22GX1.5 SAFETY (NEEDLE) ×2 IMPLANT
NS IRRIG 1000ML POUR BTL (IV SOLUTION) ×2 IMPLANT
PACK ABDOMINAL MINOR (CUSTOM PROCEDURE TRAY) ×2 IMPLANT
PENCIL BUTTON HOLSTER BLD 10FT (ELECTRODE) IMPLANT
PROTECTOR NERVE ULNAR (MISCELLANEOUS) ×2 IMPLANT
SPONGE LAP 18X18 X RAY DECT (DISPOSABLE) ×2 IMPLANT
SPONGE LAP 4X18 X RAY DECT (DISPOSABLE) ×2 IMPLANT
SUT MNCRL AB 4-0 PS2 18 (SUTURE) ×2 IMPLANT
SUT PLAIN 2 0 (SUTURE) ×1
SUT PLAIN ABS 2-0 CT1 27XMFL (SUTURE) ×1 IMPLANT
SUT VICRYL 0 TIES 12 18 (SUTURE) IMPLANT
SUT VICRYL 0 UR6 27IN ABS (SUTURE) ×2 IMPLANT
SYR CONTROL 10ML LL (SYRINGE) ×2 IMPLANT
TOWEL OR 17X24 6PK STRL BLUE (TOWEL DISPOSABLE) ×4 IMPLANT
TRAY FOLEY CATH SILVER 14FR (SET/KITS/TRAYS/PACK) ×2 IMPLANT

## 2018-03-11 NOTE — Anesthesia Preprocedure Evaluation (Signed)
Anesthesia Evaluation  Patient identified by MRN, date of birth, ID band Patient awake    Reviewed: Allergy & Precautions, NPO status , Patient's Chart, lab work & pertinent test results  Airway Mallampati: II  TM Distance: >3 FB Neck ROM: Full    Dental no notable dental hx. (+) Dental Advisory Given, Teeth Intact   Pulmonary neg pulmonary ROS, former smoker,    Pulmonary exam normal breath sounds clear to auscultation       Cardiovascular negative cardio ROS Normal cardiovascular exam Rhythm:Regular Rate:Normal     Neuro/Psych Anxiety Depression negative neurological ROS     GI/Hepatic negative GI ROS, Neg liver ROS,   Endo/Other  Hypothyroidism   Renal/GU negative Renal ROS  negative genitourinary   Musculoskeletal negative musculoskeletal ROS (+)   Abdominal   Peds negative pediatric ROS (+)  Hematology negative hematology ROS (+)   Anesthesia Other Findings   Reproductive/Obstetrics Delivered 03/11/18 at 1130, desires post partum tubal                            Anesthesia Physical Anesthesia Plan  ASA: II  Anesthesia Plan: Epidural   Post-op Pain Management:    Induction:   PONV Risk Score and Plan: 2 and Treatment may vary due to age or medical condition  Airway Management Planned: Natural Airway  Additional Equipment:   Intra-op Plan:   Post-operative Plan:   Informed Consent: I have reviewed the patients History and Physical, chart, labs and discussed the procedure including the risks, benefits and alternatives for the proposed anesthesia with the patient or authorized representative who has indicated his/her understanding and acceptance.   Dental advisory given  Plan Discussed with: CRNA  Anesthesia Plan Comments:         Anesthesia Quick Evaluation

## 2018-03-11 NOTE — Anesthesia Postprocedure Evaluation (Signed)
Anesthesia Post Note  Patient: Katherine Shaw  Procedure(s) Performed: POST PARTUM TUBAL LIGATION (Bilateral Abdomen)     Patient location during evaluation: PACU Anesthesia Type: Epidural Level of consciousness: oriented and awake and alert Pain management: pain level controlled Vital Signs Assessment: post-procedure vital signs reviewed and stable Respiratory status: spontaneous breathing, respiratory function stable and patient connected to nasal cannula oxygen Cardiovascular status: blood pressure returned to baseline and stable Postop Assessment: no headache, no backache and no apparent nausea or vomiting Anesthetic complications: no    Last Vitals:  Vitals:   03/11/18 1523 03/11/18 1730  BP: (!) 112/57 99/63  Pulse: 70   Resp: 18 20  Temp: 36.7 C 36.7 C  SpO2: 100%     Last Pain:  Vitals:   03/11/18 1730  TempSrc:   PainSc: 6    Pain Goal:                 Jonte Wollam L Earland Reish

## 2018-03-11 NOTE — Anesthesia Preprocedure Evaluation (Signed)
Anesthesia Evaluation  Patient identified by MRN, date of birth, ID band Patient awake    Reviewed: Allergy & Precautions, NPO status , Patient's Chart, lab work & pertinent test results  Airway Mallampati: III  TM Distance: >3 FB Neck ROM: Full    Dental no notable dental hx. (+) Teeth Intact   Pulmonary former smoker,    Pulmonary exam normal breath sounds clear to auscultation       Cardiovascular Exercise Tolerance: Good negative cardio ROS Normal cardiovascular exam Rhythm:Regular Rate:Normal     Neuro/Psych PSYCHIATRIC DISORDERS negative neurological ROS     GI/Hepatic negative GI ROS, Neg liver ROS,   Endo/Other  Hypothyroidism Morbid obesity  Renal/GU negative Renal ROS     Musculoskeletal   Abdominal (+) + obese,   Peds  Hematology  (+) anemia ,   Anesthesia Other Findings   Reproductive/Obstetrics (+) Pregnancy                             Lab Results  Component Value Date   WBC 9.7 03/11/2018   HGB 10.3 (L) 03/11/2018   HCT 29.6 (L) 03/11/2018   MCV 86.3 03/11/2018   PLT 176 03/11/2018    Anesthesia Physical Anesthesia Plan  ASA: III  Anesthesia Plan: Epidural   Post-op Pain Management:    Induction:   PONV Risk Score and Plan:   Airway Management Planned:   Additional Equipment:   Intra-op Plan:   Post-operative Plan:   Informed Consent: I have reviewed the patients History and Physical, chart, labs and discussed the procedure including the risks, benefits and alternatives for the proposed anesthesia with the patient or authorized representative who has indicated his/her understanding and acceptance.     Plan Discussed with:   Anesthesia Plan Comments:         Anesthesia Quick Evaluation

## 2018-03-11 NOTE — Transfer of Care (Signed)
Immediate Anesthesia Transfer of Care Note  Patient: Katherine ConchStephanie Shaw  Procedure(s) Performed: POST PARTUM TUBAL LIGATION (Bilateral Abdomen)  Patient Location: PACU  Anesthesia Type:Epidural  Level of Consciousness: sedated  Airway & Oxygen Therapy: Patient Spontanous Breathing and Patient connected to nasal cannula oxygen  Post-op Assessment: Report given to RN and Post -op Vital signs reviewed and stable  Post vital signs: Reviewed and stable  Last Vitals:  Vitals Value Taken Time  BP    Temp    Pulse    Resp    SpO2      Last Pain:  Vitals:   03/11/18 1100  TempSrc: Oral  PainSc:          Complications: No apparent anesthesia complications

## 2018-03-11 NOTE — Anesthesia Pain Management Evaluation Note (Signed)
  CRNA Pain Management Visit Note  Patient: Katherine Shaw, 27 y.o., female  "Hello I am a member of the anesthesia team at [facility]. We have an anesthesia team available at all times to provide care throughout the hospital, including epidural management and anesthesia for C-section. I don't know your plan for the delivery whether it a natural birth, water birth, IV sedation, nitrous supplementation, doula or epidural, but we want to meet your pain goals."   1.Was your pain managed to your expectations on prior hospitalizations?   Yes   2.What is your expectation for pain management during this hospitalization?     Epidural  3.How can we help you reach that goal? Epidural in place  Record the patient's initial score and the patient's pain goal.   Pain: 0  Pain Goal: 3 [facility] wants you to be able to say your pain was always managed very well.  Katherine Shaw 03/11/2018

## 2018-03-11 NOTE — Anesthesia Procedure Notes (Signed)
Epidural Patient location during procedure: OB Start time: 03/11/2018 4:25 AM End time: 03/11/2018 4:38 AM  Staffing Anesthesiologist: Trevor IhaHouser, Stephen A, MD Performed: anesthesiologist   Preanesthetic Checklist Completed: patient identified, site marked, surgical consent, pre-op evaluation, timeout performed, IV checked, risks and benefits discussed and monitors and equipment checked  Epidural Patient position: sitting Prep: site prepped and draped and DuraPrep Patient monitoring: continuous pulse ox and blood pressure Approach: midline Location: L2-L3 Injection technique: LOR air  Needle:  Needle type: Tuohy  Needle gauge: 17 G Needle length: 9 cm and 9 Needle insertion depth: 5 cm cm Catheter type: closed end flexible Catheter size: 19 Gauge Catheter at skin depth: 10 cm Test dose: negative  Assessment Events: blood not aspirated, injection not painful, no injection resistance, negative IV test and no paresthesia  Additional Notes 1 attempt . Pt tolerated procedure well.

## 2018-03-11 NOTE — Progress Notes (Signed)
OB Note Pt would like BTL. Firm fundus at the umbilicus. D/w her re: r/b/a and she would like btl. Pt NPO and d/w OR and hopefully can do today.  Katherine Shaw, Jr MD Attending Center [facility] (Faculty Practice) 03/11/2018 Time: 1157am

## 2018-03-11 NOTE — Progress Notes (Signed)
Patient ID: Katherine ConchStephanie Novella, female   DOB: 08-22-1990, 27 y.o.   MRN: [id number]  Mostly comfortable with epidural; recent SROM @0906   BP 83/46, P 71 FHR 130s, +accels, no decels Ctx q 3-4 mins with Pit Cx was 4/80/-2   IUP@term  Early active labor GBS pos  Continue to keep ctx reg Plan to check cx in 1-2 hrs or sooner prn Anticipate SVD  Cam HaiSHAW, Gracelee Stemmler CNM 03/11/2018

## 2018-03-11 NOTE — H&P (Signed)
Katherine ConchStephanie Balding is a 27 y.o. female [phone] with IUP at 2091w2d presenting for IOL for elective. [facility] at [place]  Prenatal History/Complications:   Vaginal twins 6 years ago  Past Medical History: Past Medical History:  Diagnosis Date  . Anxiety   . Depression   . Thyroid disease     Past Surgical History: Past Surgical History:  Procedure Laterality Date  . MOUTH SURGERY      Obstetrical History: OB History    Gravida  3   Para  1   Term  1   Preterm  0   AB  1   Living  2     SAB  1   TAB  0   Ectopic  0   Multiple  1   Live Births  2           Social History: Social History   Socioeconomic History  . Marital status: Married    Spouse name: Not on file  . Number of children: Not on file  . Years of education: Not on file  . Highest education level: Not on file  Occupational History  . Not on file  Social Needs  . Financial resource strain: Not hard at all  . Food insecurity:    Worry: Never true    Inability: Never true  . Transportation needs:    Medical: No    Non-medical: No  Tobacco Use  . Smoking status: Former Smoker    Packs/day: 0.50    Types: Cigarettes  . Smokeless tobacco: Never Used  Substance and Sexual Activity  . Alcohol use: Not Currently  . Drug use: No  . Sexual activity: Yes  Lifestyle  . Physical activity:    Days per week: Not on file    Minutes per session: Not on file  . Stress: Not on file  Relationships  . Social connections:    Talks on phone: Not on file    Gets together: Not on file    Attends religious service: Not on file    Active member of club or organization: Not on file    Attends meetings of clubs or organizations: Not on file    Relationship status: Not on file  Other Topics Concern  . Not on file  Social History Narrative  . Not on file    Family History: Family History  Problem Relation Age of Onset  . Diabetes Mother   . Hypertension Mother   . Stroke Maternal Aunt   .  Cancer Maternal Grandmother        ovarian  . Cancer Maternal Grandfather   . Cancer Paternal Grandfather     Allergies: No Known Allergies  Medications Prior to Admission  Medication Sig Dispense Refill Last Dose  . acetaminophen (TYLENOL) 500 MG tablet Take 1,000 mg by mouth every 6 (six) hours as needed for headache.   Taking  . levothyroxine (SYNTHROID) 50 MCG tablet Take 1 tablet (50 mcg total) by mouth daily before breakfast. 30 tablet 2 Taking  . Prenatal Vit-Fe Fumarate-FA (PRENATAL MULTIVITAMIN) TABS tablet Take 1 tablet by mouth daily at 12 noon.   Taking        Review of Systems   Constitutional: Negative for fever and chills Eyes: Negative for visual disturbances Respiratory: Negative for shortness of breath, dyspnea Cardiovascular: Negative for chest pain or palpitations  Gastrointestinal: Negative for abdominal pain, vomiting, diarrhea and constipation.   Genitourinary: Negative for dysuria and urgency Musculoskeletal: Negative for  back pain, joint pain, myalgias  Neurological: Negative for dizziness and headaches      Last menstrual period 06/02/2017. General appearance: alert, cooperative and no distress Lungs: clear to auscultation bilaterally Heart: regular rate and rhythm Abdomen: soft, non-tender; bowel sounds normal Extremities: Homans sign is negative, no sign of DVT DTR's 2+ Presentation: cephalic Fetal monitoring  Baseline: 145 bpm, Variability: Good {> 6 bpm), Accelerations: Reactive and Decelerations: Absent Uterine activity  Mild, irregular      Prenatal labs: ABO, Rh: O/Positive/-- (01/16 1541) Antibody: Negative (01/16 1541) Rubella: immune RPR: Non Reactive (05/07 1000)  HBsAg: Negative (01/16 1541)  HIV: Non Reactive (05/07 1000)    Prenatal Transfer Tool  Maternal Diabetes: No Genetic Screening: Declined Maternal Ultrasounds/Referrals: Normal Fetal Ultrasounds or other Referrals:  None Maternal Substance Abuse:   No Significant Maternal Medications:  None Significant Maternal Lab Results: Lab values include: Group B Strep positive  Clinic  [facility] Prenatal Labs  Dating First trimester US Blood type: O/Positive/-- (01/16 1541)   Genetic Screen 1 Screen:    AFP:     Quad:     NIPS: Planning Antibody:Negative (01/16 1541)  Anatomic US Incomplete views of cranium. ? UTD bilateral->Nml Rubella: 16.60 (01/16 1541)  GTT  Third trimester: Nml RPR: Non Reactive (01/16 1541)   Flu vaccine  HBsAg: Negative (01/16 1541)   TDaP vaccine 11/30/17                             Rhogam: NA HIV: Non Reactive (01/16 1541)  Baby Food Breast                                         GBS: (For PCN allergy, check sensitivities) POS  Contraception BTL need to sign Title XIX papers Pap: Nml 2019  Circumcision IP--Knows that she has to pay in advance   Pediatrician  CF:  Support Person Brandon [facility]  Prenatal Classes  Hgb electrophoresis: [place] [facility]      No results found for this or any previous visit (from the past 24 hour(s)).  Assessment: Katherine Shaw is a 27 y.o. [phone] with an IUP at 8254w2d presenting for IOL for elective.  Plan: #Labor: pitocin #Pain:  Per request #FWB Cat 1 #ID: GBS: PCN    Jacklyn ShellFrances Cresenzo-Dishmon 03/11/2018, 12:27 AM

## 2018-03-11 NOTE — Lactation Note (Signed)
This note was copied from a baby's chart. Lactation Consultation Note  Patient Name: Katherine Zachery ConchStephanie Ende ZOXWR'UToday's Date: 03/11/2018 Reason for consult: Initial assessment;Term;Maternal endocrine disorder Type of Endocrine Disorder?: Thyroid(with synthroid)  7 hours old FT female who is being exclusively BF by his mother, she's a P2 and experienced BF. She was able to BF her twins for 6 months. Mom already knows how to hand express, when she showed LC colostrum flowed very easily out of her right breast. Mom doesn't have a pump at home, [facility], pump instructions, cleaning and storage were reviewed, as well as milk storage guidelines.  Mom voiced she's been giving baby drops of colostrum through hand expression out of her nipples (pouring it on baby's mouth) that BF is going well and that she didn't need any assistance with latch at this point, baby was asleep. Asked mom to call for assistance whenever is needed.  Per mom feedings at the breast are comfortable and both of her nipples looked intact with no signs of trauma. Encouraged her to feed baby STS 8-12 times/24 hours or sooner if feeding cues are present. Cluster feeding was discussed. BF brochure, BF resources and feeding diary were also reviewed, both parents are aware of LC services and they'll call PRN.  Maternal Data Formula Feeding for Exclusion: No Has patient been taught Hand Expression?: Yes Does the patient have breastfeeding experience prior to this delivery?: Yes  Feeding Feeding Type: Breast Fed Length of feed: 5 min  Interventions Interventions: Breast feeding basics reviewed;Skin to skin;Breast compression;Hand express;Hand pump  Lactation Tools Discussed/Used Tools: Pump Breast pump type: Manual WIC Program: No Pump Review: Setup, frequency, and cleaning;Milk Storage Initiated by:: MPeck Date initiated:: 03/11/18   Consult Status Consult Status: Follow-up Date: 03/12/18 Follow-up  type: In-patient    Donice Alperin Venetia ConstableS Zuleyma Scharf 03/11/2018, 6:46 PM

## 2018-03-11 NOTE — Op Note (Signed)
Operative Note   03/11/2018  PRE-OP DIAGNOSIS: Desire for permanent sterilization.  Postpartum Day #0   POST-OP DIAGNOSIS: Same  SURGEON: Surgeon(s) and Role:    * Watha Bingharlie Estus Krakowski, MD - Primary  ASSISTANT: None  ANESTHESIA: epidural and local  PROCEDURE: mini-laparotomy, bilateral tubal ligation via Filshie Clips method  ESTIMATED BLOOD LOSS: 5mL  DRAINS: per anesthesia note  TOTAL IV FLUIDS: per anesthesia note  SPECIMENS:  none  VTE PROPHYLAXIS: SCDs to the bilateral lower extremities  ANTIBIOTICS: not indicated  COMPLICATIONS: none  DISPOSITION: PACU - hemodynamically stable.  CONDITION: stable  FINDINGS: No intra-abdominal adhesions noted. Smooth, normally contoured uterine fundus and bilateral tubes. Normal appearing tubes and normal ovaries on palpation. Some increased vascularity in the meso salpingx so filshie clips used  PROCEDURE IN DETAIL: The patient was taken to the OR where anesthesia was administed. The patient was positioned in dorsal supine. The patient was prepped and draped in the normal sterile fashion a 4cm horizontal incision was made approximately 2 finger breadths below the umbilicus, after injection with local anesthesia. The skin was then incised with the scalpel and the underlying tissue dissected with the bovie and the fascia nicked in the midline with the scalpel and then extended laterally sharply.  The abdomen was then entered bluntly and a moist lap sponge used to displace the bowel.   The right Fallopian tube was identified by tracing out to the fimbraie, grasped with the Babcock clamps. An avascular midsection of the tube approximately 3-4cm from the cornua was grasped with the babcock clamps and the filshie clip was applied. Another filshe clip was placed proximal to this for a better application to make sure the whole tube was incorporated.   Attention was then turned to the left fallopian tube after confirmation by tracing the tube out to  the fimbriae. One filshie clip was then applied in the same area as the opposite side   The lap sponge was then removed from the abdomen and the fascia closed in running fashion with 0 vicryl and the subcutaneous tissue irrigated and closed with interrupted sutures of 3-0 plain gut. The skin was then closed with 4-0 monocryl and dermabond.    The patient tolerated the procedure well. All counts were correct x 2. The patient was transferred to the recovery room awake, alert and breathing independently.   Cornelia Copaharlie Jacoby Zanni, Jr MD Attending Center for [facility](Faculty Practice)

## 2018-03-12 NOTE — Addendum Note (Signed)
Addendum  created 03/12/18 [id number] by Elgie CongoMalinova, Patience Nuzzo H, CRNA   Sign clinical note

## 2018-03-12 NOTE — Progress Notes (Signed)
Pt. c/o of having heavy feeling in thighs that is not improving. Pt able to ambulate and lift hips off bed, no swelling noted. [facility] LP[facility]et Mullings CRNA notified, no new orders.  Tylene FantasiaBelinda Jaymie Mckiddy, RN 03/12/2018 12:58 AM

## 2018-03-12 NOTE — Anesthesia Postprocedure Evaluation (Signed)
Anesthesia Post Note  Patient: Katherine Shaw  Procedure(s) Performed: POST PARTUM TUBAL LIGATION (Bilateral Abdomen)     Patient location during evaluation: Mother Baby Anesthesia Type: Epidural Level of consciousness: awake and alert Pain management: pain level controlled Vital Signs Assessment: post-procedure vital signs reviewed and stable Respiratory status: spontaneous breathing, nonlabored ventilation and respiratory function stable Cardiovascular status: stable Postop Assessment: no headache, no backache, epidural receding, able to ambulate, adequate PO intake, no apparent nausea or vomiting and patient able to bend at knees Anesthetic complications: no    Last Vitals:  Vitals:   03/11/18 2119 03/12/18 0456  BP: 92/63 98/68  Pulse: 71 69  Resp: 20 20  Temp: 36.7 C 36.8 C  SpO2:      Last Pain:  Vitals:   03/12/18 0457  TempSrc:   PainSc: 3    Pain Goal: Patients Stated Pain Goal: 4 (03/12/18 0301)               Laban EmperorMalinova,Drequan Ironside Hristova

## 2018-03-12 NOTE — Progress Notes (Signed)
Post Partum Day #1/BTL #1 Subjective: no complaints, up ad lib and tolerating PO; breastfeeding going well  Objective: Blood pressure 98/68, pulse 69, temperature 98.2 F (36.8 C), temperature source Oral, resp. rate 20, height 5\' 5"  (1.651 m), weight 115.4 kg, last menstrual period 06/02/2017, SpO2 100 %, unknown if currently breastfeeding.  Physical Exam:  General: alert and cooperative Lochia: appropriate Uterine Fundus: firm Incision: BTL incision open to air, dry DVT Evaluation: No evidence of DVT seen on physical exam.  Recent Labs    03/11/18 0110  HGB 10.3*  HCT 29.6*    Assessment/Plan: Plan for discharge tomorrow and Circumcision prior to discharge- family plans to pay today   LOS: 1 day   Cam HaiSHAW, KIMBERLY CNM 03/12/2018, 10:12 AM

## 2018-03-12 NOTE — Anesthesia Postprocedure Evaluation (Signed)
Anesthesia Post Note  Patient: Katherine Shaw  Procedure(s) Performed: AN AD HOC LABOR EPIDURAL     Patient location during evaluation: Mother Baby Anesthesia Type: Epidural Level of consciousness: awake and alert Pain management: pain level controlled Vital Signs Assessment: post-procedure vital signs reviewed and stable Respiratory status: spontaneous breathing, nonlabored ventilation and respiratory function stable Cardiovascular status: stable Postop Assessment: no headache, no backache, epidural receding, able to ambulate, adequate PO intake, no apparent nausea or vomiting and patient able to bend at knees Anesthetic complications: no    Last Vitals:  Vitals:   03/11/18 2119 03/12/18 0456  BP: 92/63 98/68  Pulse: 71 69  Resp: 20 20  Temp: 36.7 C 36.8 C  SpO2:      Last Pain:  Vitals:   03/12/18 0457  TempSrc:   PainSc: 3    Pain Goal: Patients Stated Pain Goal: 4 (03/12/18 0301)               Laban EmperorMalinova,Kamirah Shugrue Hristova

## 2018-03-13 MED ORDER — IBUPROFEN 600 MG PO TABS: 600.0000 mg | ORAL_TABLET | Freq: Four times a day (QID) | ORAL | 0 refills | Status: DC

## 2018-03-13 MED ORDER — SENNOSIDES-DOCUSATE SODIUM 8.6-50 MG PO TABS: 2.0000 | ORAL_TABLET | ORAL | 0 refills | Status: DC

## 2018-03-13 MED ORDER — OXYCODONE HCL 5 MG PO TABS: 5.0000 mg | ORAL_TABLET | ORAL | 0 refills | Status: DC | PRN

## 2018-03-13 NOTE — Discharge Instructions (Signed)
Postpartum Care After Vaginal Delivery °The period of time right after you deliver your newborn is called the postpartum period. °What kind of medical care will I receive? °· You may continue to receive fluids and medicines through an IV tube inserted into one of your veins. °· If an incision was made near your vagina (episiotomy) or if you had some vaginal tearing during delivery, cold compresses may be placed on your episiotomy or your tear. This helps to reduce pain and swelling. °· You may be given a squirt bottle to use when you go to the bathroom. You may use this until you are comfortable wiping as usual. To use the squirt bottle, follow these steps: °? Before you urinate, fill the squirt bottle with warm water. Do not use hot water. °? After you urinate, while you are sitting on the toilet, use the squirt bottle to rinse the area around your urethra and vaginal opening. This rinses away any urine and blood. °? You may do this instead of wiping. As you start healing, you may use the squirt bottle before wiping yourself. Make sure to wipe gently. °? Fill the squirt bottle with clean water every time you use the bathroom. °· You will be given sanitary pads to wear. °How can I expect to feel? °· You may not feel the need to urinate for several hours after delivery. °· You will have some soreness and pain in your abdomen and vagina. °· If you are breastfeeding, you may have uterine contractions every time you breastfeed for up to several weeks postpartum. Uterine contractions help your uterus return to its normal size. °· It is normal to have vaginal bleeding (lochia) after delivery. The amount and appearance of lochia is often similar to a menstrual period in the first week after delivery. It will gradually decrease over the next few weeks to a dry, yellow-brown discharge. For most women, lochia stops completely by 6-8 weeks after delivery. Vaginal bleeding can vary from woman to woman. °· Within the first few  days after delivery, you may have breast engorgement. This is when your breasts feel heavy, full, and uncomfortable. Your breasts may also throb and feel hard, tightly stretched, warm, and tender. After this occurs, you may have milk leaking from your breasts. Your health care provider can help you relieve discomfort due to breast engorgement. Breast engorgement should go away within a few days. °· You may feel more sad or worried than normal due to hormonal changes after delivery. These feelings should not last more than a few days. If these feelings do not go away after several days, speak with your health care provider. °How should I care for myself? °· Tell your health care provider if you have pain or discomfort. °· Drink enough water to keep your urine clear or pale yellow. °· Wash your hands thoroughly with soap and water for at least 20 seconds after changing your sanitary pads, after using the toilet, and before holding or feeding your baby. °· If you are not breastfeeding, avoid touching your breasts a lot. Doing this can make your breasts produce more milk. °· If you become weak or lightheaded, or you feel like you might faint, ask for help before: °? Getting out of bed. °? Showering. °· Change your sanitary pads frequently. Watch for any changes in your flow, such as a sudden increase in volume, a change in color, the passing of large blood clots. If you pass a blood clot from your vagina, save it   to show to your health care provider. Do not flush blood clots down the toilet without having your health care provider look at them. °· Make sure that all your vaccinations are up to date. This can help protect you and your baby from getting certain diseases. You may need to have immunizations done before you leave the hospital. °· If desired, talk with your health care provider about methods of family planning or birth control (contraception). °How can I start bonding with my baby? °Spending as much time as  possible with your baby is very important. During this time, you and your baby can get to know each other and develop a bond. Having your baby stay with you in your room (rooming in) can give you time to get to know your baby. Rooming in can also help you become comfortable caring for your baby. Breastfeeding can also help you bond with your baby. °How can I plan for returning home with my baby? °· Make sure that you have a car seat installed in your vehicle. °? Your car seat should be checked by a certified car seat installer to make sure that it is installed safely. °? Make sure that your baby fits into the car seat safely. °· Ask your health care provider any questions you have about caring for yourself or your baby. Make sure that you are able to contact your health care provider with any questions after leaving the hospital. °This information is not intended to replace advice given to you by your health care provider. Make sure you discuss any questions you have with your health care provider. °Document Released: 05/10/2007 Document Revised: 12/16/2015 Document Reviewed: 06/17/2015 °Elsevier Interactive Patient Education © 2018 [employer] ° °

## 2018-03-13 NOTE — Lactation Note (Signed)
This note was copied from a baby's chart. Lactation Consultation Note  Patient Name: Katherine Shaw OZHYQ'MToday's Date: 03/13/2018 Reason for consult: Follow-up assessment;Term;Infant weight loss P3, 42 hr female infant , weight loss 6 % Mom is experienced BF, she BF twins for 6 months. Per parents, infant had 5 wet and 4-5 soiled diapers in past 24 hours. LC in room infant had wet diaper. Mom sitting up in a chair, BF infant on right breast using the football hold, audible swallowing heard. Latch-10. Infant BF 15 minutes and still BF as LC left room.  LC reviewed I&O. Mom encouraged to feed baby 8-12 times/24 hours and with feeding cues.  LC discussed engorgement treatment and prevention. Mom made aware of O/P services, breastfeeding support groups, community resources, and our phone # for post-discharge questions.  Maternal Data    Feeding Feeding Type: Breast Fed Length of feed: 15 min(Mom still BF as LC left room)  LATCH Score Latch: Grasps breast easily, tongue down, lips flanged, rhythmical sucking.  Audible Swallowing: Spontaneous and intermittent  Type of Nipple: Everted at rest and after stimulation  Comfort (Breast/Nipple): Soft / non-tender  Hold (Positioning): No assistance needed to correctly position infant at breast.  LATCH Score: 10  Interventions    Lactation Tools Discussed/Used     Consult Status Consult Status: Complete Date: 03/13/18    Katherine Shaw 03/13/2018, 6:29 AM

## 2018-03-13 NOTE — Discharge Summary (Signed)
OB Discharge Summary     Patient Name: Zachery ConchStephanie Heiland DOB: [birth year] MRN: [id number]  Date of admission: 03/11/2018 Delivering MD: Cam HaiSHAW, KIMBERLY D   Date of discharge: 03/13/2018  Admitting diagnosis: INDUCTION Intrauterine pregnancy: 6276w2d     Secondary diagnosis:  Active Problems:   Pregnancy Hypothyroidism      Discharge diagnosis: Term Pregnancy Delivered                                                                                                Post partum procedures:postpartum tubal ligation  Augmentation: Pitocin  Complications: None  Hospital course:  Induction of Labor With Vaginal Delivery   27 y.o. yo [id number] at 5876w2d was admitted to the hospital 03/11/2018 for induction of labor.  Indication for induction: elective.  Patient had an uncomplicated labor course as follows: Membrane Rupture Time/Date: 9:06 AM ,03/11/2018   Intrapartum Procedures: Episiotomy: None [1]                                         Lacerations:  None [1]  Patient had delivery of a Viable infant.  Information for the patient's newborn:  Patrice Paradiseabon, Boy Megen [[id number]][[id number]]  Delivery Method: Vaginal, Spontaneous(Filed from Delivery Summary)   03/11/2018  Details of delivery can be found in separate delivery note.  Patient had a routine postpartum course. Patient is discharged home 03/13/18.  Physical exam  Vitals:   03/12/18 0456 03/12/18 0945 03/12/18 2251 03/13/18 0611  BP: 98/68 105/67 (!) 104/51 106/61  Pulse: 69 68 69 62  Resp: 20 16 20 18   Temp: 98.2 F (36.8 C) 98.5 F (36.9 C) 98.4 F (36.9 C) 98.2 F (36.8 C)  TempSrc: Oral Oral Oral Oral  SpO2:  100%    Weight:      Height:       General: alert and cooperative Lochia: appropriate Uterine Fundus: firm Incision: Dressing is clean, dry, and intact DVT Evaluation: Negative Homan's sign. Labs: Lab Results  Component Value Date   WBC 9.7 03/11/2018   HGB 10.3 (L) 03/11/2018   HCT 29.6 (L) 03/11/2018   MCV 86.3  03/11/2018   PLT 176 03/11/2018   No flowsheet data found.  Discharge instruction: per After Visit Summary and "Baby and Me Booklet".  After visit meds:  Allergies as of 03/13/2018   No Known Allergies     Medication List    TAKE these medications   acetaminophen 500 MG tablet Commonly known as:  TYLENOL Take 1,000 mg by mouth every 6 (six) hours as needed for headache.   ibuprofen 600 MG tablet Commonly known as:  ADVIL,MOTRIN Take 1 tablet (600 mg total) by mouth every 6 (six) hours.   levothyroxine 50 MCG tablet Commonly known as:  SYNTHROID, LEVOTHROID Take 1 tablet (50 mcg total) by mouth daily before breakfast.   oxyCODONE 5 MG immediate release tablet Commonly known as:  Oxy IR/ROXICODONE Take 1 tablet (5 mg total) by mouth every 4 (four) hours as needed (pain scale 4-7).  senna-docusate 8.6-50 MG tablet Commonly known as:  Senokot-S Take 2 tablets by mouth daily. Start taking on:  03/14/2018       Diet: routine diet  Activity: Advance as tolerated. Pelvic rest for 6 weeks.   Outpatient follow up:4 weeks Follow up Appt: Future Appointments  Date Time Provider Department Center  04/15/2018  8:15 AM Levie HeritageStinson, Jacob J, DO [id number] None   Follow up Visit:No follow-ups on file.  Postpartum contraception: Tubal Ligation  Newborn Data: Live born female  Birth Weight: 7 lb 2.3 oz (3240 g) APGAR: 8, 9  Newborn Delivery   Birth date/time:  03/11/2018 11:30:00 Delivery type:  Vaginal, Spontaneous     Baby Feeding: Breast Disposition:home with mother   03/13/2018 De Hollingsheadatherine L Curlie Macken, DO

## 2018-03-15 ENCOUNTER — Encounter (HOSPITAL_COMMUNITY): Payer: Self-pay

## 2018-03-16 ENCOUNTER — Inpatient Hospital Stay (HOSPITAL_COMMUNITY): Admission: RE | Admit: 2018-03-16 | Payer: Medicaid Other | Source: Ambulatory Visit

## 2018-04-15 ENCOUNTER — Ambulatory Visit: Payer: Medicaid Other | Admitting: Family Medicine

## 2018-05-20 ENCOUNTER — Ambulatory Visit (HOSPITAL_BASED_OUTPATIENT_CLINIC_OR_DEPARTMENT_OTHER)
Admission: RE | Admit: 2018-05-20 | Discharge: 2018-05-20 | Disposition: A | Discharge status: Home / Self Care | Payer: Medicaid Other | Source: Ambulatory Visit | Attending: Family Medicine | Admitting: Family Medicine

## 2018-05-20 ENCOUNTER — Encounter: Payer: Self-pay | Admitting: Family Medicine

## 2018-05-20 ENCOUNTER — Ambulatory Visit (INDEPENDENT_AMBULATORY_CARE_PROVIDER_SITE_OTHER): Payer: Medicaid Other | Admitting: Family Medicine

## 2018-05-20 DIAGNOSIS — N719 Inflammatory disease of uterus, unspecified: Secondary | ICD-10-CM

## 2018-05-20 DIAGNOSIS — Z1389 Encounter for screening for other disorder: Secondary | ICD-10-CM

## 2018-05-20 DIAGNOSIS — O99345 Other mental disorders complicating the puerperium: Secondary | ICD-10-CM

## 2018-05-20 DIAGNOSIS — F53 Postpartum depression: Secondary | ICD-10-CM

## 2018-05-20 MED ORDER — MISOPROSTOL 200 MCG PO TABS: ORAL_TABLET | ORAL | 1 refills | Status: AC

## 2018-05-20 MED ORDER — AMOXICILLIN-POT CLAVULANATE 875-125 MG PO TABS: 1.0000 | ORAL_TABLET | Freq: Two times a day (BID) | ORAL | 1 refills | Status: AC

## 2018-05-20 MED ORDER — NORETHINDRONE 0.35 MG PO TABS: 1.0000 | ORAL_TABLET | Freq: Every day | ORAL | 11 refills | Status: AC

## 2018-05-20 NOTE — Progress Notes (Signed)
Discussed [place] with patient. No hypervascularity suggestive of trophoblastic disease or retained POCs. Will give cytotec buccally and then place on micronor to help with bleeding.

## 2018-05-20 NOTE — Addendum Note (Signed)
Addended by: Levie Heritage on: 05/20/2018 01:08 PM   Modules accepted: Orders

## 2018-05-20 NOTE — Progress Notes (Signed)
Post Partum Exam  Katherine Shaw is a 27 y.o. G79P2013 female who presents for a postpartum visit. She is 10 weeks postpartum following a spontaneous vaginal delivery. I have fully reviewed the prenatal and intrapartum course. The delivery was at 40 gestational weeks and 2 days.  Anesthesia: epidural. Postpartum course has been difficult- feeling depressed. Baby's course has been unremarkable. Baby is feeding by breast. Bleeding thick, heavy lochia. Bowel function is normal. Bladder function is normal. Patient is sexually active. Contraception method is tubal ligation. Postpartum depression screening: positive score 23.  Patient also having increased pelvic pain. Had some fevers and chills, but thought that was due to a cold.  The following portions of the patient's history were reviewed and updated as appropriate: allergies, current medications, past family history, past medical history, past social history, past surgical history and problem list.  Review of Systems Pertinent items are noted in HPI.    Objective:    BP 116/78 mmHg  Pulse 78  Resp 16  Ht 5\' 5"  (1.651 m)  Wt 211 lb (95.709 kg)  BMI 35.11 kg/m2  Breastfeeding? Yes  General:  alert, cooperative and no distress  Lungs: clear to auscultation bilaterally  Heart:  regular rate and rhythm, S1, S2 normal, no murmur, click, rub or gallop  Abdomen: soft, non-tender; bowel sounds normal; no masses,  no organomegaly   Vulva:  normal  Vagina: normal vagina, no discharge, exudate, lesion, or erythema  Cervix:  cervical motion tenderness and multiparous appearance  Corpus: regular contour, firm, tender and tender on motion  Adnexa:  no mass, fullness, tenderness        Assessment:    Postpartum exam. Endometritis. Postpartum depression  Plan:   1. Contraception: tubal ligation 2. Antibiotics: augmentin for endometritis. With increased bleeding, will get [place] to r/o retained POC. 3. Discussed postpartum depression and etiology.  Discussed possibility of medications - patient doesn't think that this is warrented. Has good support system. If bleeding becomes under control, thinks that things will improve. 4. Follow up in: 2 week or as needed.

## 2018-05-20 NOTE — Progress Notes (Signed)
Pt c/o abdominal pain and bleeding. Scored 23 on PP depression screening

## 2018-09-02 ENCOUNTER — Other Ambulatory Visit: Payer: Self-pay

## 2018-09-02 ENCOUNTER — Emergency Department (HOSPITAL_BASED_OUTPATIENT_CLINIC_OR_DEPARTMENT_OTHER)
Admission: EM | Admit: 2018-09-02 | Discharge: 2018-09-02 | Disposition: A | Discharge status: Home / Self Care | Payer: Medicaid Other | Attending: Emergency Medicine | Admitting: Emergency Medicine

## 2018-09-02 ENCOUNTER — Encounter (HOSPITAL_BASED_OUTPATIENT_CLINIC_OR_DEPARTMENT_OTHER): Payer: Self-pay | Admitting: *Deleted

## 2018-09-02 DIAGNOSIS — F17210 Nicotine dependence, cigarettes, uncomplicated: Secondary | ICD-10-CM | POA: Diagnosis not present

## 2018-09-02 DIAGNOSIS — R51 Headache: Secondary | ICD-10-CM | POA: Diagnosis present

## 2018-09-02 DIAGNOSIS — Z79899 Other long term (current) drug therapy: Secondary | ICD-10-CM | POA: Diagnosis not present

## 2018-09-02 DIAGNOSIS — M79602 Pain in left arm: Secondary | ICD-10-CM | POA: Insufficient documentation

## 2018-09-02 DIAGNOSIS — E039 Hypothyroidism, unspecified: Secondary | ICD-10-CM | POA: Insufficient documentation

## 2018-09-02 DIAGNOSIS — R519 Headache, unspecified: Secondary | ICD-10-CM

## 2018-09-02 MED ORDER — SODIUM CHLORIDE 0.9 % IV BOLUS
1000.0000 mL | Freq: Once | INTRAVENOUS | Status: AC
  Administered 2018-09-02: 1000 mL via INTRAVENOUS

## 2018-09-02 MED ORDER — DIPHENHYDRAMINE HCL 50 MG/ML IJ SOLN
25.0000 mg | Freq: Once | INTRAMUSCULAR | Status: AC
  Administered 2018-09-02: 25 mg via INTRAVENOUS
  Filled 2018-09-02: qty 1

## 2018-09-02 MED ORDER — DEXAMETHASONE SODIUM PHOSPHATE 10 MG/ML IJ SOLN
10.0000 mg | Freq: Once | INTRAMUSCULAR | Status: AC
Start: 2018-09-02 — End: 2018-09-02
  Administered 2018-09-02: 10 mg via INTRAVENOUS
  Filled 2018-09-02: qty 1

## 2018-09-02 MED ORDER — METOCLOPRAMIDE HCL 5 MG/ML IJ SOLN
10.0000 mg | Freq: Once | INTRAMUSCULAR | Status: AC
  Administered 2018-09-02: 10 mg via INTRAVENOUS
  Filled 2018-09-02: qty 2

## 2018-09-02 NOTE — ED Triage Notes (Signed)
She had ganglion cyst surgery 2 days ago. She has bruising to her left upper arm. Her lower arm is in a splint.

## 2018-09-02 NOTE — ED Notes (Signed)
Pt currently breast feeding.

## 2018-09-02 NOTE — ED Provider Notes (Signed)
[facility] EMERGENCY DEPARTMENT Provider Note   CSN: [id number] Arrival date & time: 09/02/18  1357     History   Chief Complaint Chief Complaint  Patient presents with  . Bleeding/Bruising    HPI Katherine Shaw is a 28 y.o. female presents to the emergency department with chief complaint of bruising and headache.  Patient is currently breast-feeding. She had surgery to remove a ganglion cyst on her left wrist x2 days ago [place] [facility] [place].  She was told there were no complications with the surgery.  Today she noticed a bruise on her left bicep.  She does not recall any trauma to her arm and is unsure how it got there.  She is wearing a splint on her lower right arm. She reports associated episodic pain in the wrist that has been present since her surgery. She describes it as an ache rating 4/10 severity.   Patient is also complaining of a headache.  The headache has been present x3 days.  She describes the pain as located behind bilateral eyes.  Describes the pain as a pressure and denies radiation of pain. She rates it 8 out of 10 in severity.  She has a history of headaches and reports this one feels similar.  She reports trouble focusing her eyes because of the pain. Again she has not taken any pain or OTC medications for her wrist pain or headache because of breast-feeding.  Patient reports associated dizziness which she describes as feeling like the room is spinning.  Denies fever, visual changes, nausea, vomiting, abdominal pain, neck pain.    Past Medical History:  Diagnosis Date  . Anxiety   . Depression   . Thyroid disease     Patient Active Problem List   Diagnosis Date Noted  . Sickle cell trait (HCC) 09/02/2017  . Hypothyroidism affecting pregnancy in second trimester 08/11/2017  . Ganglion cyst of dorsum of left wrist 07/15/2017    Past Surgical History:  Procedure Laterality Date  . GANGLION CYST EXCISION    . MOUTH SURGERY      . TUBAL LIGATION Bilateral 03/11/2018   Procedure: POST PARTUM TUBAL LIGATION;  Surgeon: [place] BingPickens, Charlie, MD;  [facility];  Service: Gynecology;  Laterality: Bilateral;     OB History    Gravida  3   Para  2   Term  2   Preterm  0   AB  1   Living  3     SAB  1   TAB  0   Ectopic  0   Multiple  1   Live Births  3            Home Medications    Prior to Admission medications   Medication Sig Start Date End Date Taking? Authorizing Provider  levothyroxine (SYNTHROID) 50 MCG tablet Take 1 tablet (50 mcg total) by mouth daily before breakfast. 01/25/18  Yes Levie HeritageStinson, Jacob J, DO  amoxicillin-clavulanate (AUGMENTIN) 875-125 MG tablet Take 1 tablet by mouth 2 (two) times daily. 05/20/18   Levie HeritageStinson, Jacob J, DO  misoprostol (CYTOTEC) 200 MCG tablet Place four tablets in between your gums and cheeks (two tablets on each side) 05/20/18   Levie HeritageStinson, Jacob J, DO  norethindrone (MICRONOR,CAMILA,ERRIN) 0.35 MG tablet Take 1 tablet (0.35 mg total) by mouth daily. 05/20/18   Levie HeritageStinson, Jacob J, DO  Prenatal Vit-Fe Fumarate-FA (MULTIVITAMIN-PRENATAL) 27-0.8 MG TABS tablet Take 1 tablet by mouth daily at 12 noon.  [provider]    Family History Family History  Problem Relation Age of Onset  . Diabetes Mother   . Hypertension Mother   . Stroke Maternal Aunt   . Cancer Maternal Grandmother        ovarian  . Cancer Maternal Grandfather   . Cancer Paternal Grandfather     Social History Social History   Tobacco Use  . Smoking status: Current Some Day Smoker    Packs/day: 0.50    Types: Cigarettes  . Smokeless tobacco: Never Used  Substance Use Topics  . Alcohol use: Not Currently  . Drug use: No     Allergies   Patient has no known allergies.   Review of Systems Review of Systems  Constitutional: Negative for chills and fever.  HENT: Negative for congestion, sinus pressure and sore throat.   Eyes: Negative for pain and visual  disturbance.  Respiratory: Negative for chest tightness and shortness of breath.   Cardiovascular: Negative for chest pain and palpitations.  Gastrointestinal: Negative for abdominal pain, diarrhea, nausea and vomiting.  Genitourinary: Negative for difficulty urinating and hematuria.  Musculoskeletal: Negative for back pain and neck pain.  Skin: Positive for color change. Negative for rash and wound.  Neurological: Positive for dizziness and headaches. Negative for syncope.  Hematological: Does not bruise/bleed easily.     Physical Exam Updated Vital Signs BP 114/78 (BP Location: Right Arm)   Pulse 74   Temp 97.8 F (36.6 C) (Oral)   Resp 18   Ht 5\' 6"  (1.676 m)   Wt 109.8 kg   LMP 08/20/2018   SpO2 100%   BMI 39.06 kg/m   Physical Exam Vitals signs and nursing note reviewed.  Constitutional:      Appearance: She is not ill-appearing or toxic-appearing.  HENT:     Head: Normocephalic and atraumatic.     Comments: Bilateral temples non tender to palpation.    Nose: Nose normal.     Mouth/Throat:     Mouth: Mucous membranes are moist.     Pharynx: Oropharynx is clear.  Eyes:     Extraocular Movements: Extraocular movements intact.     Conjunctiva/sclera: Conjunctivae normal.     Pupils: Pupils are equal, round, and reactive to light.  Neck:     Musculoskeletal: Normal range of motion.  Cardiovascular:     Rate and Rhythm: Normal rate and regular rhythm.     Pulses: Normal pulses.          Radial pulses are 2+ on the right side and 2+ on the left side.     Heart sounds: Normal heart sounds.  Pulmonary:     Effort: Pulmonary effort is normal.     Breath sounds: Normal breath sounds.  Abdominal:     General: There is no distension.     Palpations: Abdomen is soft.     Tenderness: There is no abdominal tenderness. There is no guarding or rebound.  Musculoskeletal: Normal range of motion.  Skin:    General: Skin is warm and dry.     Capillary Refill: Capillary  refill takes less than 2 seconds.  Neurological:     Mental Status: She is alert. Mental status is at baseline.     Motor: No weakness.     Comments: Speech is clear and goal oriented, follows commands CN III-XII intact, no facial droop Normal strength in upper and lower extremities bilaterally including dorsiflexion and plantar flexion, strong grip strength in right hand, but wearing  splint on left hand. Sensation normal to light and sharp touch Moves extremities without ataxia, coordination intact Normal finger to nose and rapid alternating movements Normal gait and balance   Psychiatric:        Behavior: Behavior normal.      ED Treatments / Results  Labs (all labs ordered are listed, but only abnormal results are displayed) Labs Reviewed - No data to display  EKG None  Radiology No results found.  Procedures Procedures (including critical care time)  Medications Ordered in ED Medications  sodium chloride 0.9 % bolus 1,000 mL ( Intravenous Stopped 09/02/18 1645)  diphenhydrAMINE (BENADRYL) injection 25 mg (25 mg Intravenous Given 09/02/18 1539)  metoCLOPramide (REGLAN) injection 10 mg (10 mg Intravenous Given 09/02/18 1540)  dexamethasone (DECADRON) injection 10 mg (10 mg Intravenous Given 09/02/18 1539)     Initial Impression / Assessment and Plan / ED Course  I have reviewed the triage vital signs and the nursing notes.  Pertinent labs & imaging results that were available during my care of the patient were reviewed by me and considered in my medical decision making (see chart for details).   Patient is afebrile, nontoxic-appearing. Pt is wearing a volar splint from her surgery. I unwrapped the ace bandage to check radial pulse. She has minimal swelling in her hand. She is intact to sharp and dull sensation, able to wiggle all fingers. She has a small purple bruise to left bicep. The upper arm is soft, non tender. She does not have pain out of proportion on my exam. She  denies  persistent deep ache or burning pain, paresthesias. It is unlikely this is compartment syndrome. I discussed the signs to look for with compartment syndrome.  She has not tried any treatment for her headache yet.  We discussed options to treat her headache in the emergency department with Tylenol or combination of Benadryl, Reglan, Decadron.  Patient is currently breast-feeding, but will use formula and pump and dump for the next 24 hours.   Pt HA treated with Benadryl, Reglan, Decadron and improved while in ED.  Presentation is like pts typical HA and non concerning for [facility] ICH, Meningitis, or temporal arteritis. Pt is afebrile with no focal neuro deficits, nuchal rigidity, or change in vision. Pt is to follow up with PCP to discuss prophylactic medication.  Discussed strict ED return precautions. Pt verbalized understanding of and is in agreement with this plan. Pt stable for discharge home at this time.    Final Clinical Impressions(s) / ED Diagnoses   Final diagnoses:  Acute nonintractable headache, unspecified headache type  Left arm pain    ED Discharge Orders    None       Sherene Sires, PA-C 09/02/18 1729    Melene Plan, DO 09/02/18 1805

## 2018-09-02 NOTE — Discharge Instructions (Addendum)
You have been seen today for headache and arm pain. Please read and follow all provided instructions. Return to the emergency room for worsening condition or new concerning symptoms.    1. Medications:  Tylenol is safe in breastfeeding, however you can call your OB to clarify this or get their recommendation.  2. Treatment: rest, increase fluid intake  3. Follow Up: Please follow up with your primary doctor in 2-5 days for discussion of headache treatments and further evaluation after today's visit; Call today to arrange your follow up.    ?

## 2019-12-06 IMAGING — US US TRANSVAGINAL NON-OB
1 series · 14 of 25 positions shown · non-contrast
Comparison: None

CLINICAL DATA: Pelvic pain and bleeding, 10 weeks postpartum,
endometritis

EXAM:
ULTRASOUND PELVIS TRANSVAGINAL
TECHNIQUE: Transvaginal ultrasound examination of the pelvis was performed
including evaluation of the uterus, ovaries, adnexal regions, and
pelvic cul-de-sac.

[Series 1: us transvaginal non-ob · 0.11mm/px · 14 of 71 slices shown]
[im 1/71]
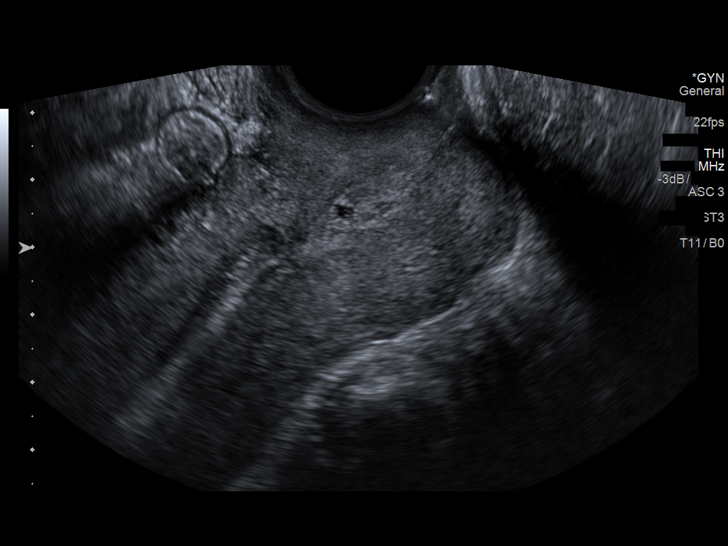
[im 6/71]
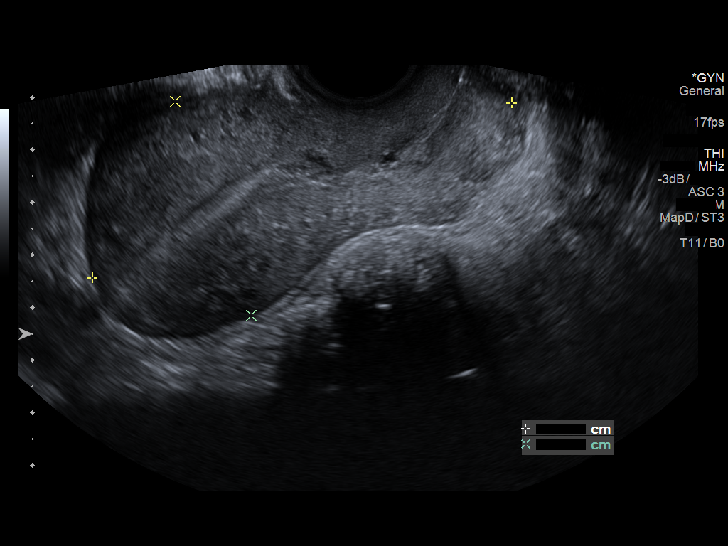
[im 12/71]
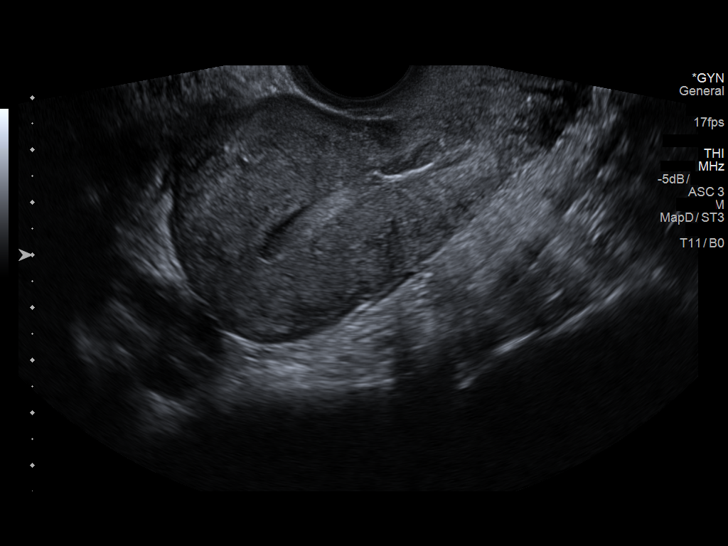
[im 18/71]
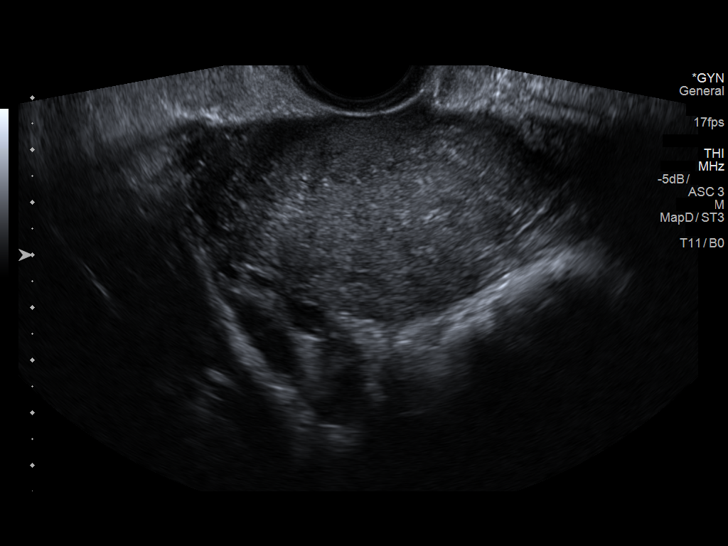
[im 24/71]
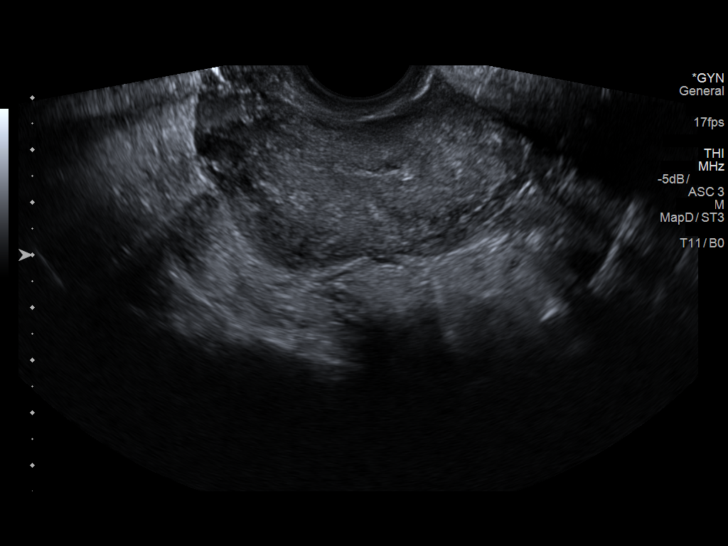
[im 27/71]
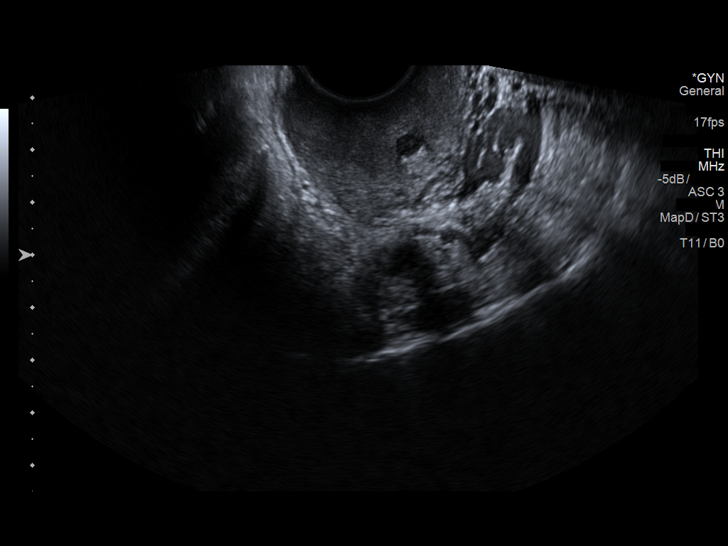
[im 33/71]
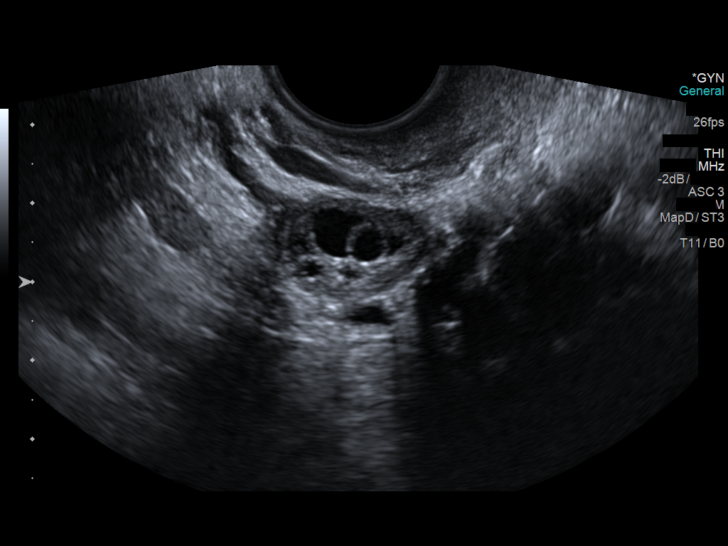
[im 38/71]
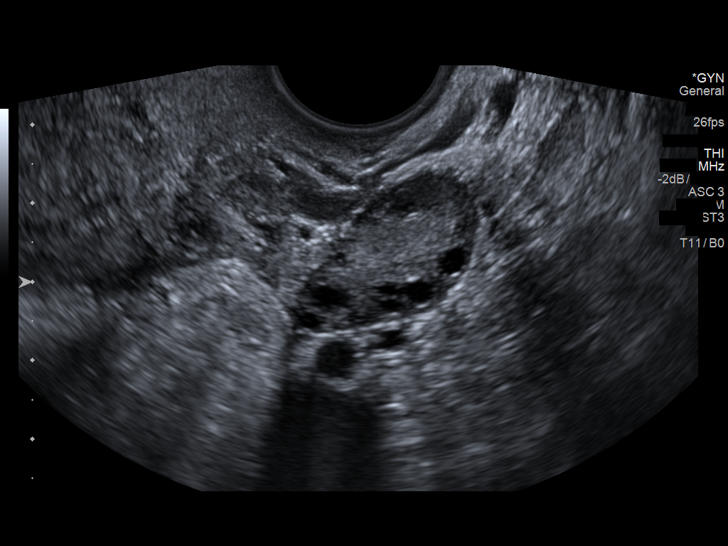
[im 44/71]
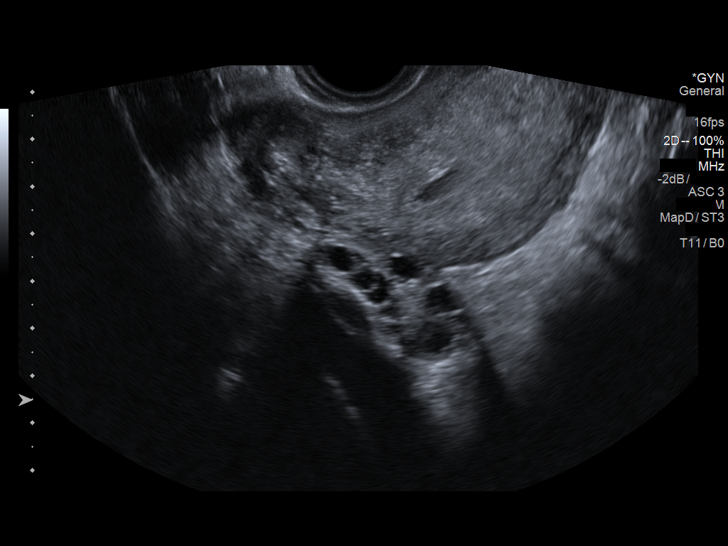
[im 47/71]
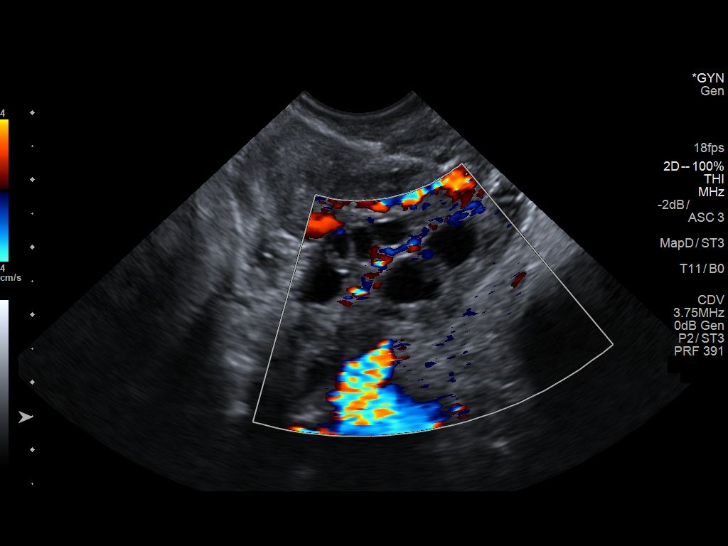
[im 53/71]
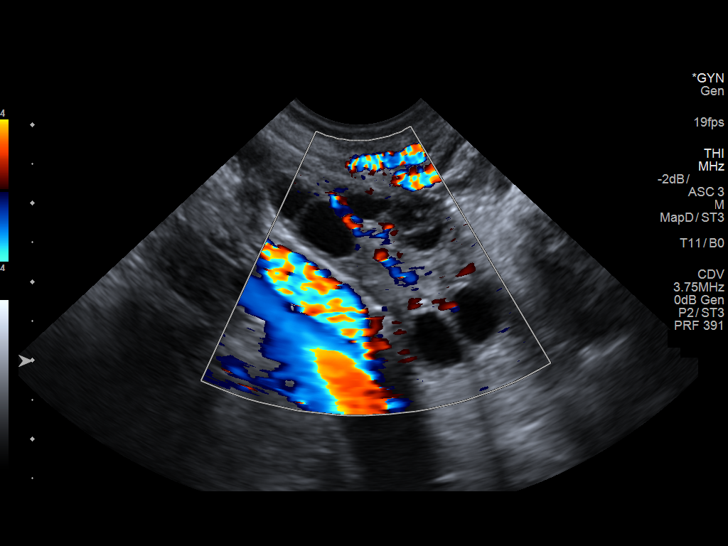
[im 59/71]
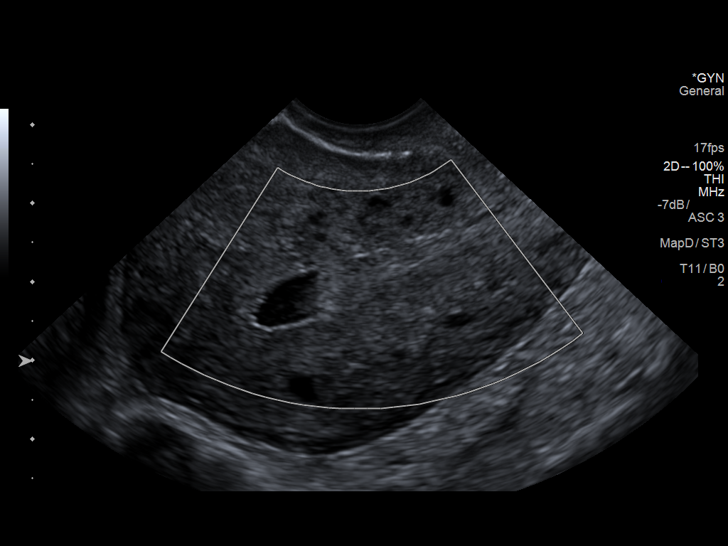
[im 65/71]
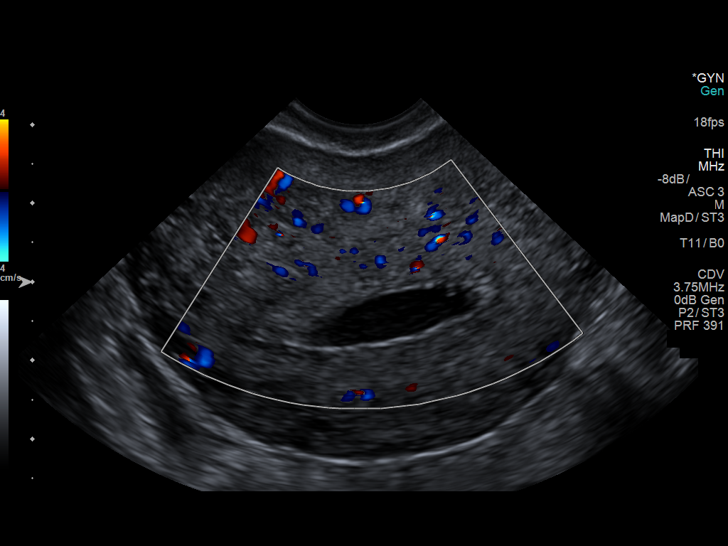
[im 71/71]
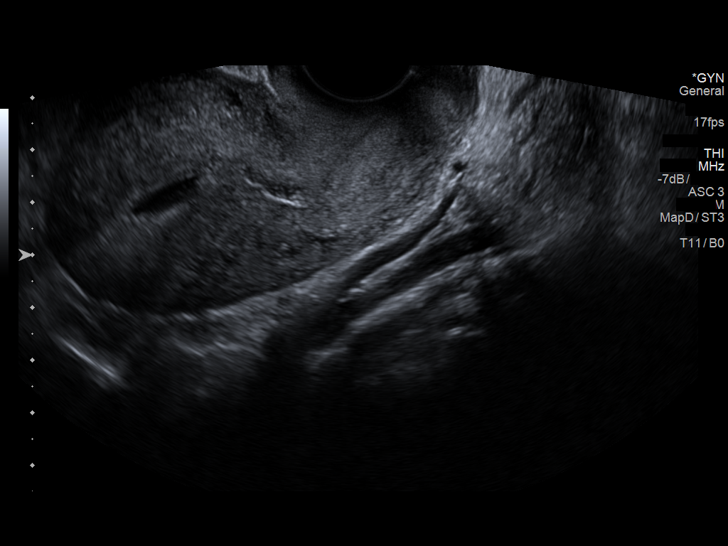

[14 of 25 positions shown; findings below may reference images not displayed]

FINDINGS: Uterus

Measurements: 8.7 x 4.3 x 6.0 cm.  Normal morphology without mass

Endometrium

Thickness: 7 mm thick. Endometrial fluid identified at fundal
portion of the endometrial canal. Focal hypoechoic material within
the endometrial canal up to 7 mm thick without internal blood flow
on color Doppler imaging. No discrete echogenic foci.

Right ovary

Measurements: 3.8 x 1.9 x 2.9 cm. Normal morphology without mass.

Left ovary

Measurements: 2.8 x 1.9 x 2.1 cm. Normal morphology without mass.

Other findings:  No abnormal free fluid.  No adnexal masses.
IMPRESSION: Abnormal hypoechoic material within the endometrial canal, less than
10 mm thick and demonstrating no internal blood flow on color
Doppler imaging.

Findings likely represent blood clot within the endometrial canal
and no definite evidence of retained products of conception is
identified.

## 2020-02-20 IMAGING — US US MFM OB DETAIL+14 WK
1 series · 14 of 28 positions shown · non-contrast
Comparison: none

[Series 1: us mfm ob detail+14 wk · 87 acquisitions, 14 frames shown]
[im 4/87]
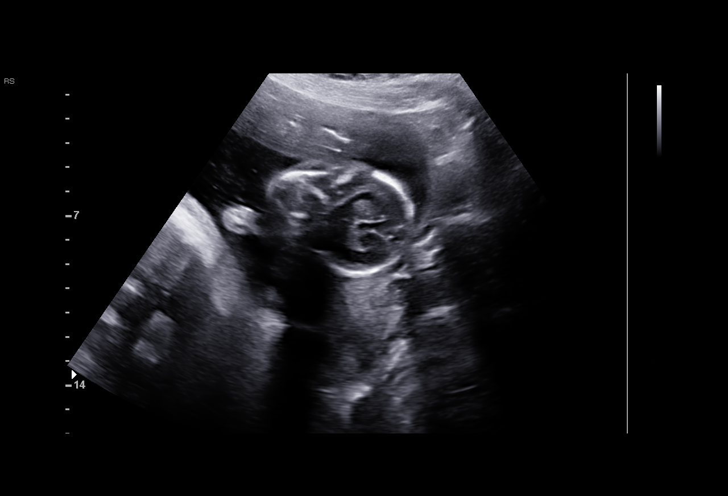
[im 10/87]
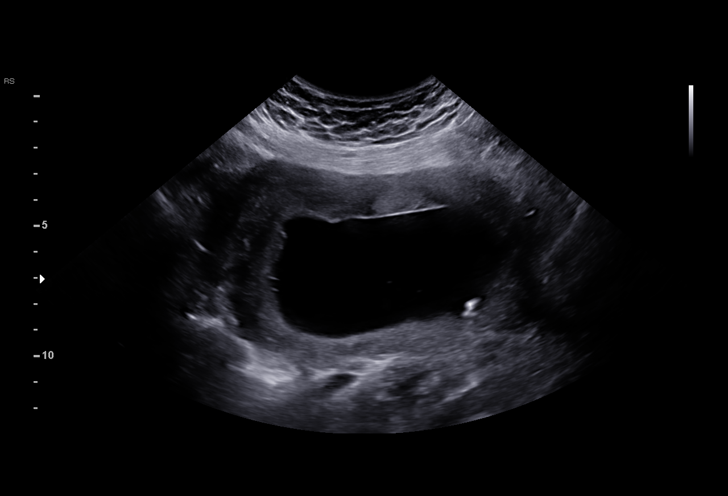
[im 16/87]
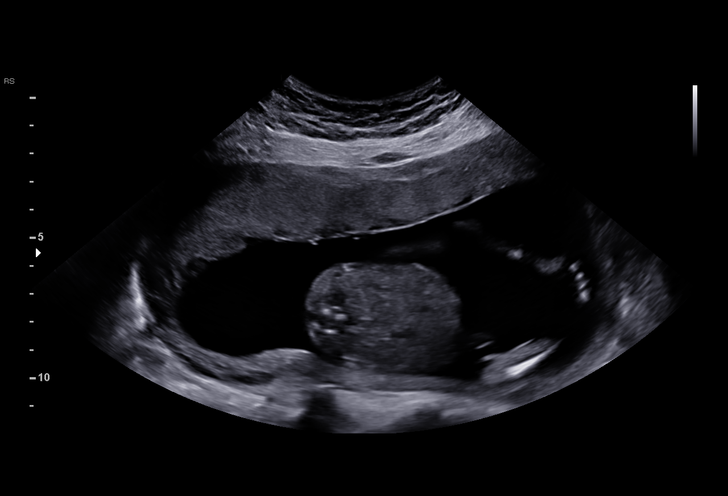
[im 23/87]
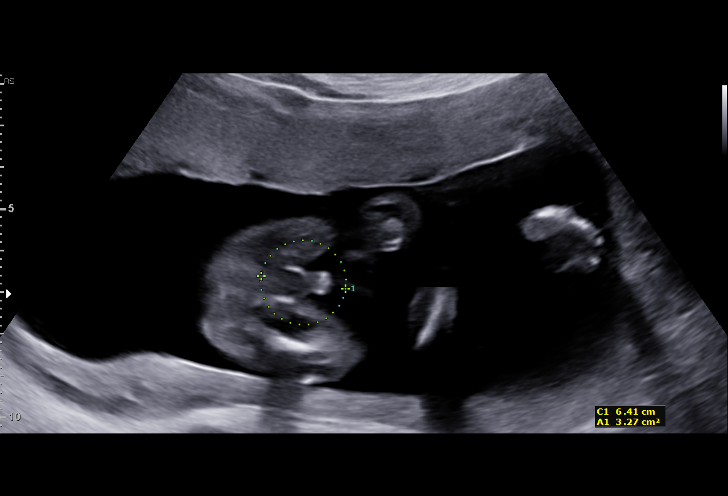
[im 29/87]
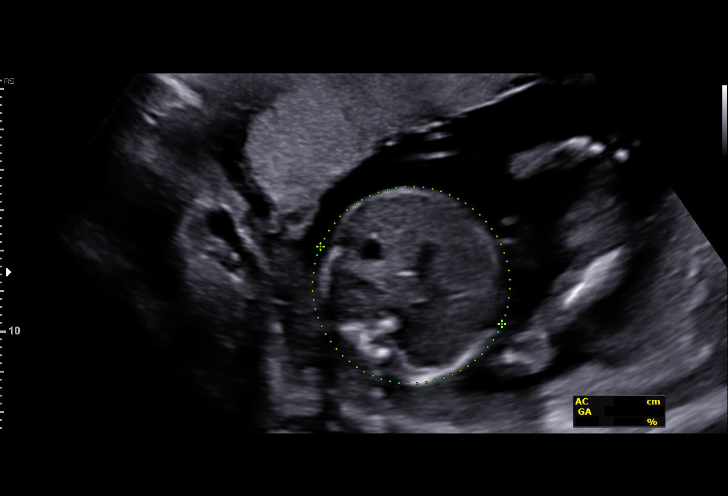
[im 36/87]
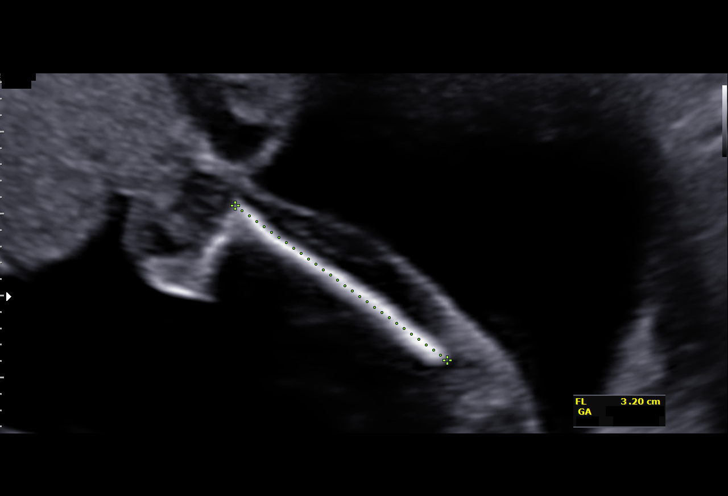
[im 42/87]
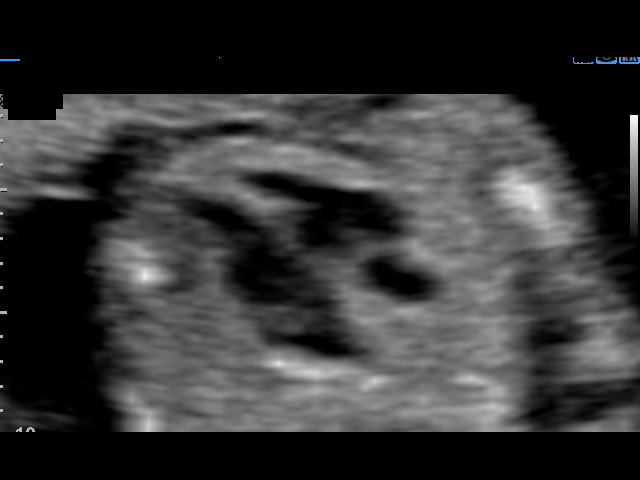
[im 48/87]
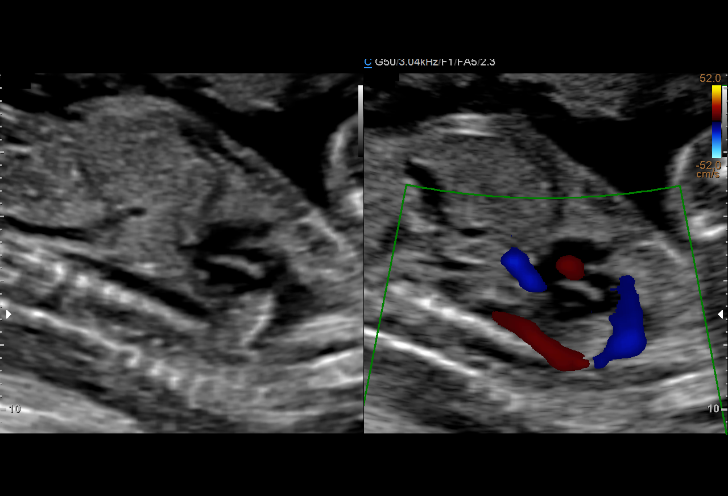
[im 55/87]
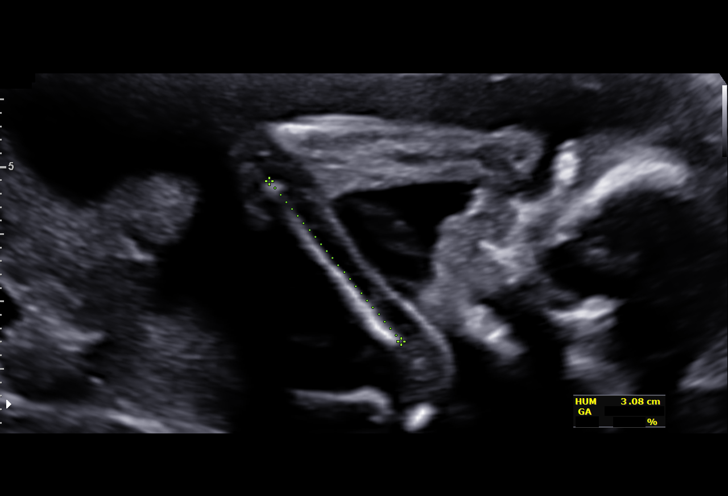
[im 61/87]
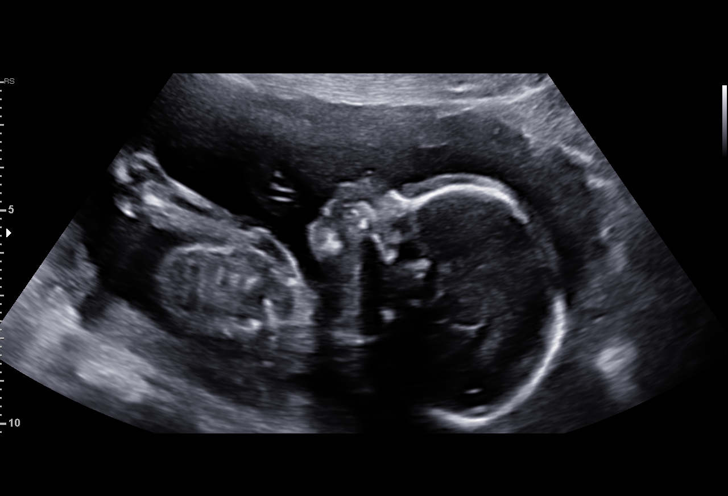
[im 67/87]
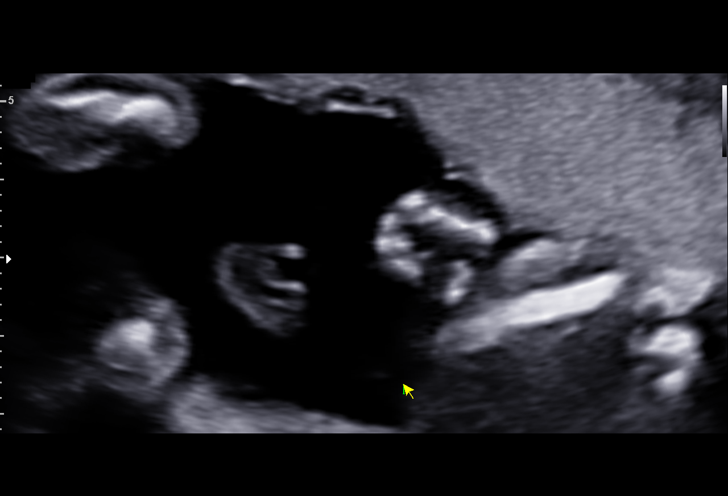
[im 74/87]
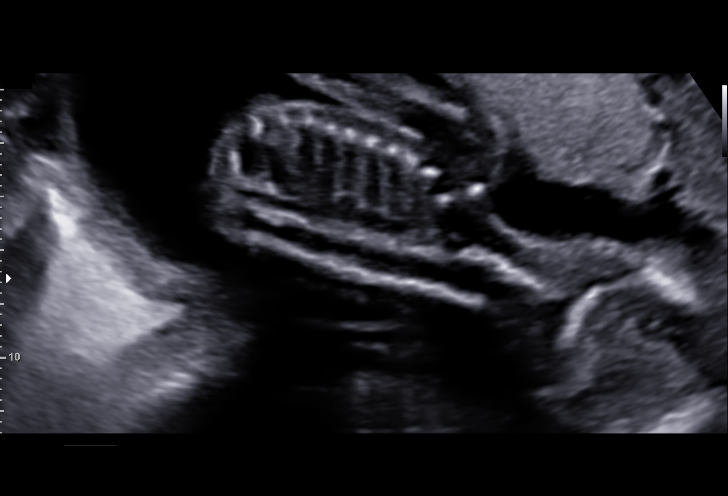
[im 80/87]
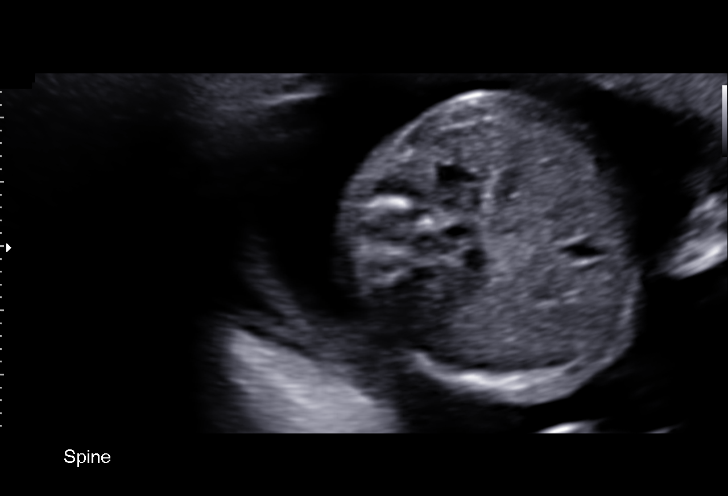
[im 87/87]
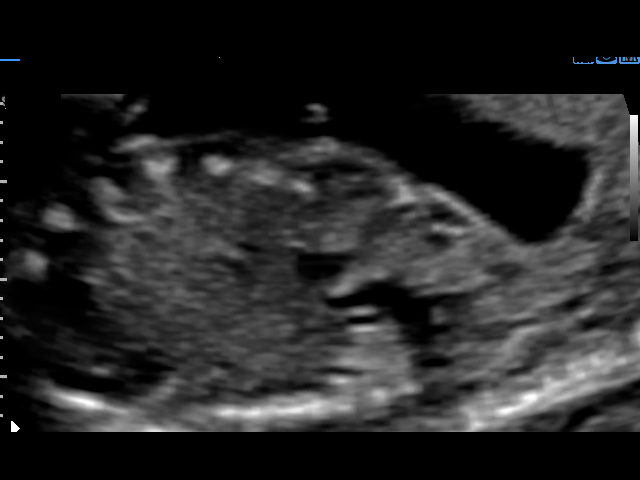

[14 of 28 positions shown; findings below may reference images not displayed]

Indications

20 weeks gestation of pregnancy
Encounter for antenatal screening for
malformations
Obesity complicating pregnancy, second
trimester
History of sickle cell trait
Hypothyroid
OB History

Gravidity:    3         Term:   1         SAB:   1
Living:       2
Fetal Evaluation

Num Of Fetuses:     1
Fetal Heart         155
Rate(bpm):
Cardiac Activity:   Observed
Presentation:       Cephalic
Placenta:           Anterior, above cervical os
P. Cord Insertion:  Visualized, central

Amniotic Fluid
AFI FV:      Subjectively within normal limits

Largest Pocket(cm)
5.44
Biometry
BPD:      44.4  mm     G. Age:  19w 3d         26  %    CI:        71.21   %    70 - 86
FL/HC:      18.9   %    16.8 -
HC:      167.6  mm     G. Age:  19w 3d         19  %    HC/AC:      1.12        1.09 -
AC:      149.4  mm     G. Age:  20w 1d         51  %    FL/BPD:     71.4   %
FL:       31.7  mm     G. Age:  19w 6d         38  %    FL/AC:      21.2   %    20 - 24
HUM:      30.8  mm     G. Age:  20w 2d         59  %

Est. FW:     323  gm    0 lb 11 oz      49  %
Gestational Age

LMP:           20w 0d        Date:  06/02/17                 EDD:   03/09/18
U/S Today:     19w 5d                                        EDD:   03/11/18
Best:          20w 0d     Det. By:  LMP  (06/02/17)          EDD:   03/09/18
Anatomy

Cranium:               Appears normal         Aortic Arch:            Appears normal
Cavum:                 Not well visualized    Ductal Arch:            Appears normal
Ventricles:            Appears normal         Diaphragm:              Appears normal
Choroid Plexus:        Appears normal         Stomach:                Appears normal, left
sided
Cerebellum:            Not well visualized    Abdomen:                Appears normal
Posterior Fossa:       Not well visualized    Abdominal Wall:         Appears nml (cord
insert, abd wall)
Nuchal Fold:           Not well visualized    Cord Vessels:           Appears normal (3
vessel cord)
Face:                  Appears normal         Kidneys:                Appear normal
(orbits and profile)
Lips:                  Not well visualized    Bladder:                Appears normal
Thoracic:              Appears normal         Spine:                  Not well visualized
Heart:                 Appears normal         Upper Extremities:      Appears normal
(4CH, axis, and situs
RVOT:                  Not well visualized    Lower Extremities:      Appears normal
LVOT:                  Appears normal

Other:  Fetus appears to be a male. Heels and 5th digit visualized.
Technically difficult due to fetal position.
Cervix Uterus Adnexa

Cervix
Length:           3.69  cm.
Normal appearance by transabdominal scan.

Uterus
No abnormality visualized.

Left Ovary
Not visualized.

Right Ovary
Not visualized.

Adnexa:       No abnormality visualized. No adnexal mass
visualized.
Impression

Singleton intrauterine pregnancy at 20+0 weeks with JANAE
hemoglobin and hypothyroidism here for anatomic survey
Review of the anatomy shows no sonographic markers for
aneuploidy or structural anomalies
There is a possibility there is bilateral UTD; the AP views are
suboptimal and measurements are borderline
However, views of the posterior fossa, RVOT and spine
should be considered suboptimal secondary to fetal position
Amniotic fluid volume is normal
Estimated fetal weight shows growth in the 49th percentile
Recommendations

Recommend follow-up ultrasound examination in 4 weeks for
completion of anatomc survey
Discussed possibility of fetal UTD with the patient and her
partner. Will reassess in 4 weeks
FOB does not believe he has ever been tested for
hemoglobinopathies; would recommend that this be done as
soon as practical

## 2020-04-30 IMAGING — US US MFM OB FOLLOW-UP
1 series · 14 of 28 positions shown · non-contrast
Comparison: none

[Series 1: us mfm ob follow-up · 53 acquisitions, 14 frames shown]
[im 2/53]
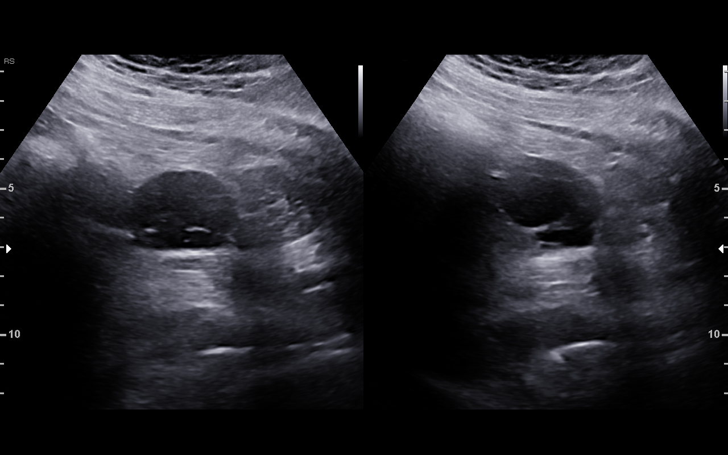
[im 6/53]
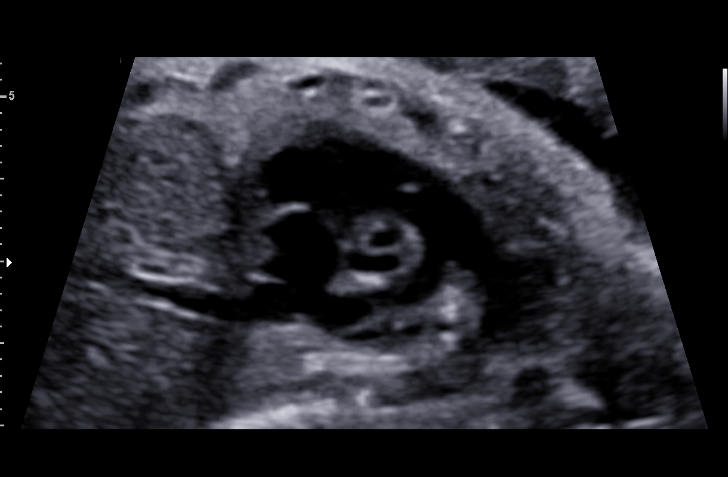
[im 10/53]
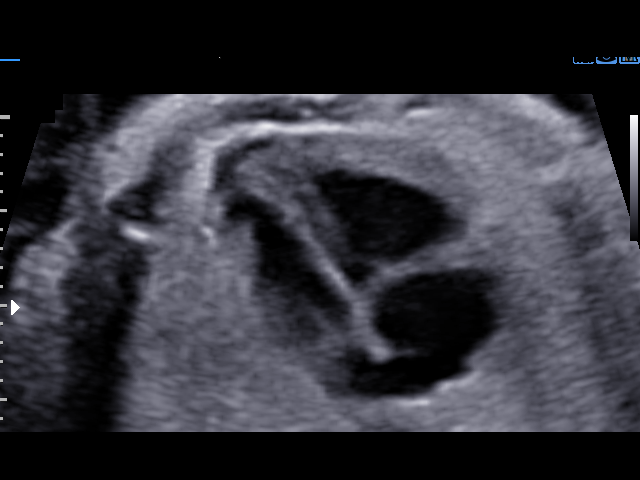
[im 14/53]
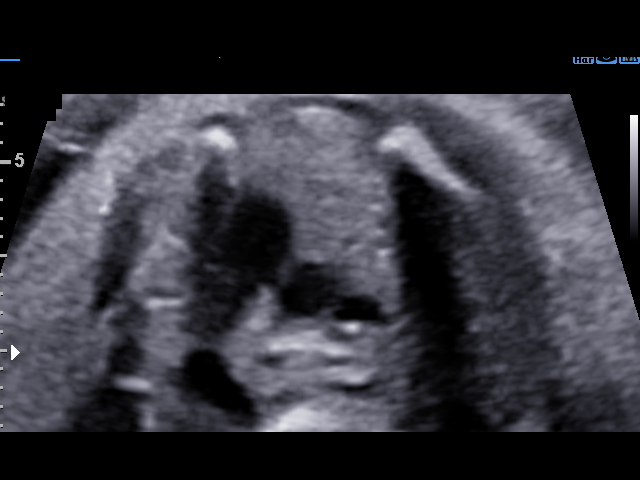
[im 18/53]
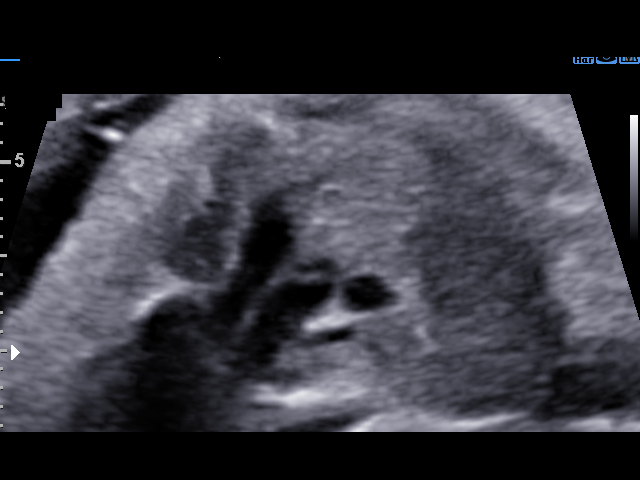
[im 22/53]
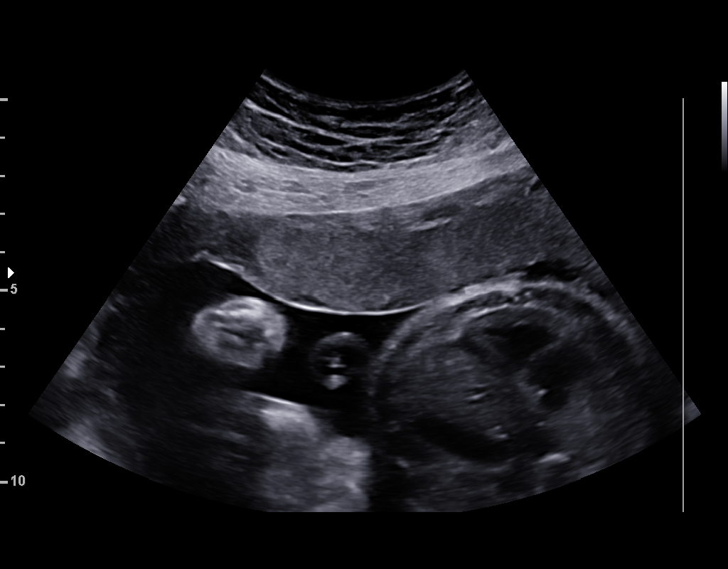
[im 26/53]
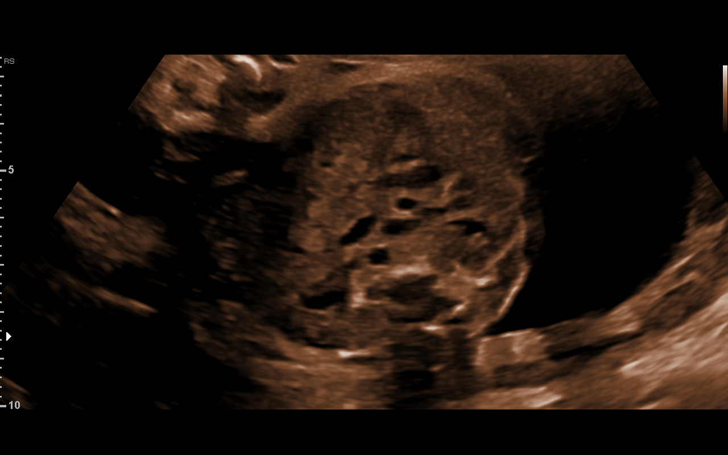
[im 29/53]
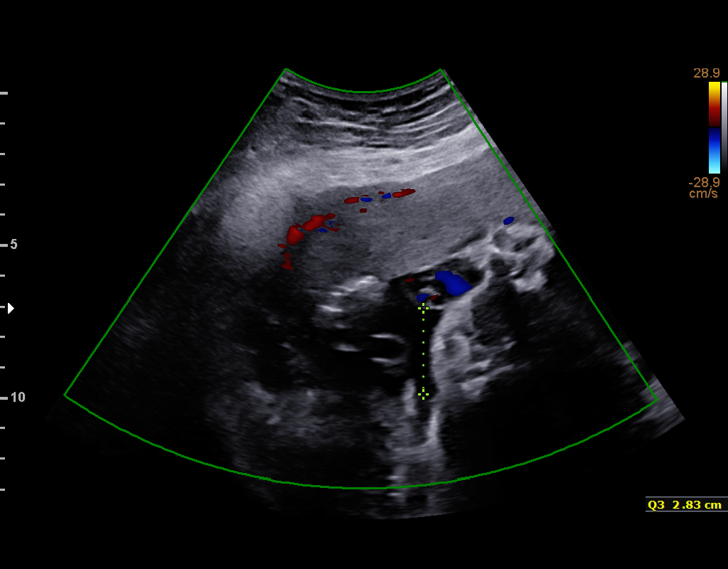
[im 33/53]
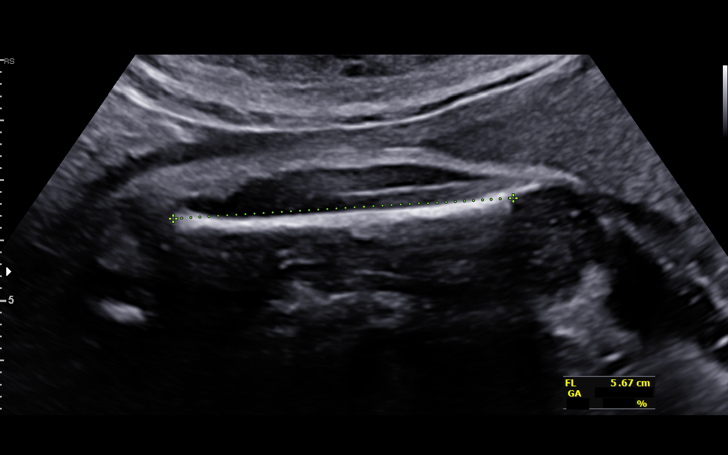
[im 37/53]
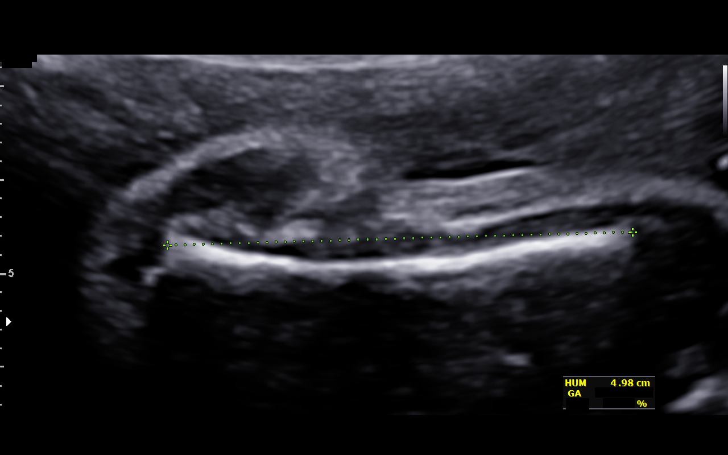
[im 41/53]
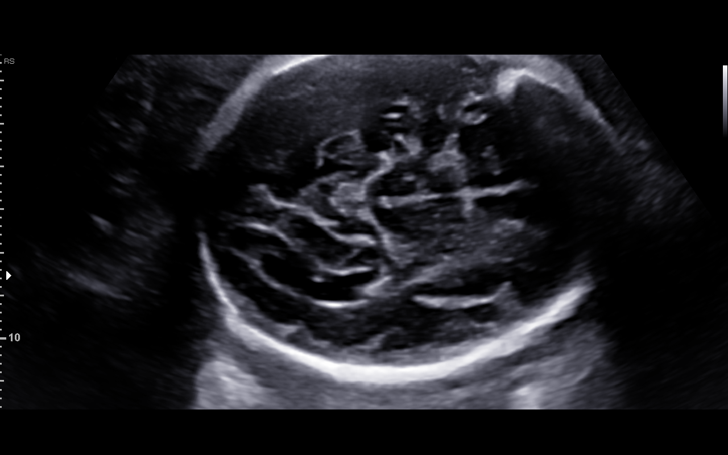
[im 45/53]
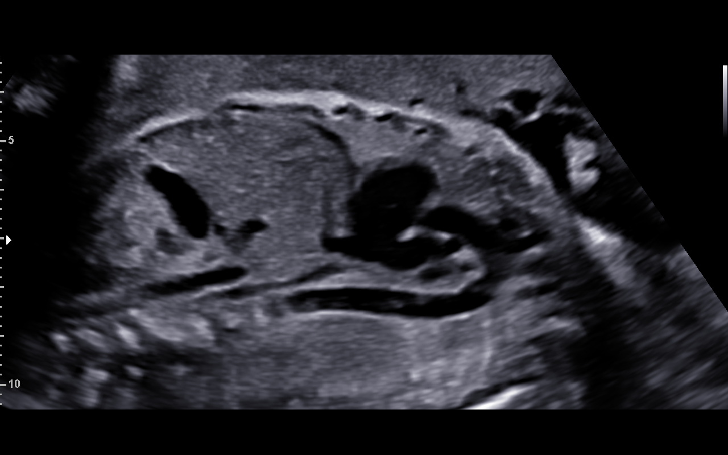
[im 49/53]
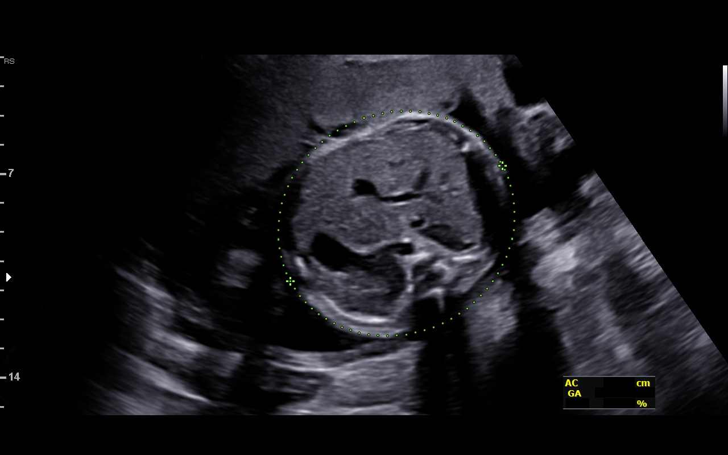
[im 53/53]
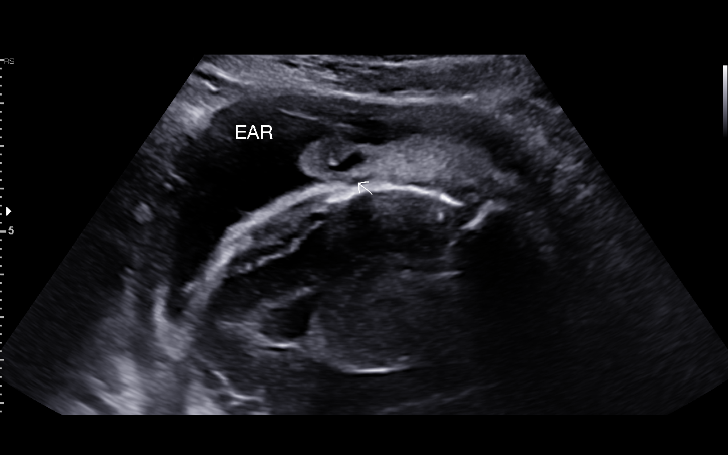

[14 of 28 positions shown; findings below may reference images not displayed]

Indications

30 weeks gestation of pregnancy
History of sickle cell trait
Hypothyroid
Obesity complicating pregnancy, third
trimester
Encounter for other antenatal screening
follow-up
OB History

Gravidity:    3         Term:   1         SAB:   1
Living:       2
Fetal Evaluation

Num Of Fetuses:     1
Fetal Heart         150
Rate(bpm):
Cardiac Activity:   Observed
Presentation:       Cephalic
Placenta:           Anterior, above cervical os
P. Cord Insertion:  Previously Visualized

Amniotic Fluid
AFI FV:      Subjectively within normal limits

AFI Sum(cm)     %Tile       Largest Pocket(cm)
17.1            63

RUQ(cm)       RLQ(cm)       LUQ(cm)        LLQ(cm)
4.65
Biometry

BPD:      77.5  mm     G. Age:  31w 1d         72  %    CI:        74.41   %    70 - 86
FL/HC:      19.6   %    19.2 -
HC:      285.2  mm     G. Age:  31w 2d         53  %    HC/AC:      1.13        0.99 -
AC:      253.4  mm     G. Age:  29w 4d         31  %    FL/BPD:     72.0   %    71 - 87
FL:       55.8  mm     G. Age:  29w 3d         20  %    FL/AC:      22.0   %    20 - 24
HUM:      49.9  mm     G. Age:  29w 2d         38  %

Est. FW:    4252  gm      3 lb 3 oz     49  %
Gestational Age

LMP:           30w 0d        Date:  06/02/17                 EDD:   03/09/18
U/S Today:     30w 3d                                        EDD:   03/06/18
Best:          30w 0d     Det. By:  LMP  (06/02/17)          EDD:   03/09/18
Anatomy

Cranium:               Appears normal         Aortic Arch:            Appears normal
Cavum:                 Previously seen        Ductal Arch:            Previously seen
Ventricles:            Appears normal         Diaphragm:              Previously seen
Choroid Plexus:        Previously seen        Stomach:                Appears normal, left
sided
Cerebellum:            Previously seen        Abdomen:                Appears normal
Posterior Fossa:       Previously seen        Abdominal Wall:         Previously seen
Nuchal Fold:           Not applicable (>20    Cord Vessels:           Previously seen
wks GA)
Face:                  Orbits and profile     Kidneys:                Appear normal
previously seen
Lips:                  Previously seen        Bladder:                Appears normal
Thoracic:              Appears normal         Spine:                  Previously seen
Heart:                 Appears normal         Upper Extremities:      Previously seen
(4CH, axis, and situs
RVOT:                  Appears normal         Lower Extremities:      Previously seen
LVOT:                  Appears normal

Other:  Fetus appears to be a male. Heels and 5th digit previously visualized.
Nasal bone previously visualized.
Cervix Uterus Adnexa

Cervix
Not visualized (advanced GA >77wks)

Uterus
No abnormality visualized.

Left Ovary
Within normal limits.

Right Ovary
Not visualized.

Adnexa:       No abnormality visualized. No adnexal mass
visualized.
Impression

Single living intrauterine pregnancy at 30w 0d.
Cephalic presentation.
Placenta Anterior, above cervical os.
Normal amniotic fluid volume.
Appropriate interval fetal growth.
Normal interval fetal anatomy.
Recommendations

Follow-up ultrasounds as clinically indicated.
